# Patient Record
Sex: Female | Born: 2000 | Race: Black or African American | Hispanic: No | Marital: Single | Smoking: Never smoker
Health system: Southern US, Community
[De-identification: ages and names within clinical notes are randomized; demographics above are authoritative.]

## PROBLEM LIST (undated history)

## (undated) ENCOUNTER — Inpatient Hospital Stay (HOSPITAL_COMMUNITY): Payer: Self-pay

## (undated) DIAGNOSIS — J45909 Unspecified asthma, uncomplicated: Secondary | ICD-10-CM

## (undated) DIAGNOSIS — Z202 Contact with and (suspected) exposure to infections with a predominantly sexual mode of transmission: Secondary | ICD-10-CM

## (undated) HISTORY — PX: NO PAST SURGERIES: SHX2092

---
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
## 2018-06-14 ENCOUNTER — Emergency Department (HOSPITAL_COMMUNITY): Admission: EM | Admit: 2018-06-14 | Payer: No Typology Code available for payment source | Source: Home / Self Care

## 2018-09-14 ENCOUNTER — Ambulatory Visit (HOSPITAL_COMMUNITY)
Admission: EM | Admit: 2018-09-14 | Discharge: 2018-09-14 | Disposition: A | Discharge status: Home / Self Care | Payer: Medicaid Other | Attending: Family Medicine | Admitting: Family Medicine

## 2018-09-14 ENCOUNTER — Encounter (HOSPITAL_COMMUNITY): Payer: Self-pay | Admitting: Emergency Medicine

## 2018-09-14 ENCOUNTER — Other Ambulatory Visit: Payer: Self-pay

## 2018-09-14 DIAGNOSIS — Z3202 Encounter for pregnancy test, result negative: Secondary | ICD-10-CM | POA: Diagnosis not present

## 2018-09-14 DIAGNOSIS — N898 Other specified noninflammatory disorders of vagina: Secondary | ICD-10-CM | POA: Diagnosis not present

## 2018-09-14 LAB — POCT URINALYSIS DIP (DEVICE)
Bilirubin Urine: NEGATIVE
Glucose, UA: NEGATIVE mg/dL
Nitrite: NEGATIVE
Protein, ur: 100 mg/dL — AB
Specific Gravity, Urine: >=1.03 (ref 1.005–1.030)
Urobilinogen, UA: 1 mg/dL (ref 0.0–1.0)
pH: 5.5 (ref 5.0–8.0)

## 2018-09-14 LAB — POCT PREGNANCY, URINE: Preg Test, Ur: NEGATIVE

## 2018-09-14 MED ORDER — CLINDAMYCIN PHOSPHATE (1 DOSE) 2 % VA CREA: 5.0000 g | TOPICAL_CREAM | Freq: Every day | VAGINAL | 0 refills | Status: DC

## 2018-09-14 NOTE — ED Triage Notes (Signed)
Pt here for vaginal discharge  

## 2018-09-14 NOTE — ED Provider Notes (Signed)
[facility]    CSN: [id number] Arrival date & time: 09/14/18  1242      History   Chief Complaint Chief Complaint  Patient presents with  . Vaginal Discharge    HPI Connie Clark is a 18 y.o. female history of asthma, presenting today for evaluation of vaginal discharge.  Patient states that over the past week she has had occasional dysuria, notes her urine has been darker and with a stronger odor than normal.  She is also had associated discharge.  Occasional burning sensation associated with this.  Denies itching.  Denies pelvic pain, abdominal pain.  Denies nausea or vomiting.  Denies fevers.  Last menstrual cycle was at the end of July.  Is not on any form of birth control.  Patient does state that she had a history of BV previously, but believes she did not take the full medicine.  States that her urinary symptoms have been present since she gave birth 9 months ago.  She is not currently breast-feeding.  Patient has reported allergy to Flagyl causing lip swelling and sores on lips.  HPI  Past Medical History:  Diagnosis Date  . Asthma   . Chlamydia contact, treated     Patient Active Problem List   Diagnosis Date Noted  . Chlamydia 08/12/2016  . Lactose intolerance 03/04/2014  . Acne 07/12/2012  . Asthma 01/09/2012  . 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 NO PAST SURGERIES      OB History    Gravida  1   Para  1   Term  1   Preterm  0   AB  0   Living  1     SAB  0   TAB  0   Ectopic  0   Multiple  0   Live Births  1            Home Medications    Prior to Admission medications   Medication Sig Start Date End Date Taking? Authorizing Provider  albuterol (PROVENTIL HFA;VENTOLIN HFA) 108 (90 Base) MCG/ACT inhaler Inhale 1-2 puffs into the lungs every 6 (six) hours as needed for wheezing or shortness of breath.    [provider]  Clindamycin Phosphate, 1  Dose, vaginal cream Apply 5 g topically at bedtime. For 7 days 09/14/18   Dicky Boer C, PA-C  metroNIDAZOLE (METROGEL VAGINAL) 0.75 % vaginal gel Place 1 Applicatorful vaginally 2 (two) times daily. 12/09/17   Jorje Guild, NP  norethindrone (MICRONOR,CAMILA,ERRIN) 0.35 MG tablet Take 1 tablet (0.35 mg total) by mouth daily. 12/06/17   Jorje Guild, NP    Family History Family History  Problem Relation Age of Onset  . Hypertension Father     Social History Social History   Tobacco Use  . Smoking status: Never Smoker  . Smokeless tobacco: Never Used  Substance Use Topics  . Alcohol use: No    Frequency: Never  . Drug use: No     Allergies   Flagyl [metronidazole]   Review of Systems Review of Systems  Constitutional: Negative for fever.  Respiratory: Negative for shortness of breath.   Cardiovascular: Negative for chest pain.  Gastrointestinal: Negative for abdominal pain, diarrhea, nausea and vomiting.  Genitourinary: Positive for dysuria, urgency and vaginal discharge. Negative for flank pain, frequency, genital sores, hematuria, menstrual problem, vaginal bleeding and vaginal pain.  Musculoskeletal: Negative for back pain.  Skin: Negative for rash.  Neurological: Negative for dizziness, light-headedness and headaches.     Physical Exam Triage Vital Signs ED Triage Vitals  Enc Vitals Group     BP 09/14/18 1317 120/71     Pulse Rate 09/14/18 1317 61     Resp 09/14/18 1317 18     Temp 09/14/18 1317 98.3 F (36.8 C)     Temp Source 09/14/18 1317 Oral     SpO2 09/14/18 1317 100 %     Weight --      Height --      Head Circumference --      Peak Flow --      Pain Score 09/14/18 1318 0     Pain Loc --      Pain Edu? --      Excl. in [place]? --    No data found.  Updated Vital Signs BP 120/71 (BP Location: Right Arm)   Pulse 61   Temp 98.3 F (36.8 C) (Oral)   Resp 18   SpO2 100%   Visual Acuity Right Eye Distance:   Left Eye Distance:   Bilateral  Distance:    Right Eye Near:   Left Eye Near:    Bilateral Near:     Physical Exam Vitals signs and nursing note reviewed.  Constitutional:      General: She is not in acute distress.    Appearance: She is well-developed.  HENT:     Head: Normocephalic and atraumatic.  Eyes:     Conjunctiva/sclera: Conjunctivae normal.  Neck:     Musculoskeletal: Neck supple.  Cardiovascular:     Rate and Rhythm: Normal rate and regular rhythm.     Heart sounds: No murmur.  Pulmonary:     Effort: Pulmonary effort is normal. No respiratory distress.     Breath sounds: Normal breath sounds.  Abdominal:     Palpations: Abdomen is soft.     Tenderness: There is no abdominal tenderness.     Comments: Soft, nondistended, nontender to light deep palpation throughout abdomen.  Skin:    General: Skin is warm and dry.  Neurological:     Mental Status: She is alert.      UC Treatments / Results  Labs (all labs ordered are listed, but only abnormal results are displayed) Labs Reviewed  POCT URINALYSIS DIP (DEVICE) - Abnormal; Notable for the following components:      Result Value   Ketones, ur TRACE (*)    Hgb urine dipstick MODERATE (*)    Protein, ur 100 (*)    Leukocytes,Ua SMALL (*)    All other components within normal limits  URINE CULTURE  POC URINE PREG, ED  POCT PREGNANCY, URINE  CERVICOVAGINAL ANCILLARY ONLY    EKG   Radiology No results found.  Procedures Procedures (including critical care time)  Medications Ordered in UC Medications - No data to display  Initial Impression / Assessment and Plan / UC Course  I have reviewed the triage vital signs and the nursing notes.  Pertinent labs & imaging results that were available during my care of the patient were reviewed by me and considered in my medical decision making (see chart for details).     Patient with abnormal vaginal discharge as well as urinary symptoms.  UA mildly suggestive of UTI versus small leuks  from discharge, but given patient's reported urinary symptoms have been going on since she was last pregnant will defer treatment empirically treat for BV.  Urine culture obtained.  Will  treat with clindamycin vaginal cream at bedtime x7 days.  Will call with results of culture and vaginal swab and alter treatment as needed.Discussed strict return precautions. Patient verbalized understanding and is agreeable with plan.  Final Clinical Impressions(s) / UC Diagnoses   Final diagnoses:  Vaginal discharge     Discharge Instructions     Clindamycin vaginal cream nightly for 7 days to treat bacterial vaginosis  I will call with results of urine and send iin further meds if needed  Drink plenty of fluids  Swab should return in 2-3 days  Follow up if symptoms not resolivng or wrosening   ED Prescriptions    Medication Sig Dispense Auth. Provider   Clindamycin Phosphate, 1 Dose, vaginal cream Apply 5 g topically at bedtime. For 7 days 35 g Chianna Spirito, WaikeleHallie C, PA-C     Controlled Substance Prescriptions [place] Controlled Substance Registry consulted? Not Applicable   Lew DawesWieters, Coley Kulikowski C, [place]PA-C 09/14/18 1552

## 2018-09-14 NOTE — Discharge Instructions (Addendum)
Clindamycin vaginal cream nightly for 7 days to treat bacterial vaginosis  I will call with results of urine and send iin further meds if needed  Drink plenty of fluids  Swab should return in 2-3 days  Follow up if symptoms not resolivng or wrosening

## 2018-09-17 ENCOUNTER — Telehealth (HOSPITAL_COMMUNITY): Payer: Self-pay | Admitting: Emergency Medicine

## 2018-09-17 LAB — URINE CULTURE: Culture: >=100000 — AB

## 2018-09-17 MED ORDER — CEPHALEXIN 500 MG PO CAPS: 500.0000 mg | ORAL_CAPSULE | Freq: Two times a day (BID) | ORAL | 0 refills | Status: AC

## 2018-09-17 NOTE — Telephone Encounter (Signed)
Urine culture positive for >100 e coli, pt was not treated, will send antibiotics to preferred pharmacy, pt contacted and made aware, all questions answered.

## 2018-09-20 LAB — CERVICOVAGINAL ANCILLARY ONLY
Candida vaginitis: NEGATIVE
Chlamydia: NEGATIVE
Neisseria Gonorrhea: NEGATIVE
Trichomonas: NEGATIVE

## 2019-02-18 ENCOUNTER — Encounter (HOSPITAL_COMMUNITY): Payer: Self-pay

## 2019-02-18 ENCOUNTER — Other Ambulatory Visit: Payer: Self-pay

## 2019-02-18 ENCOUNTER — Ambulatory Visit (HOSPITAL_COMMUNITY)
Admission: EM | Admit: 2019-02-18 | Discharge: 2019-02-18 | Disposition: A | Discharge status: Home / Self Care | Payer: Medicaid Other | Attending: Family Medicine | Admitting: Family Medicine

## 2019-02-18 DIAGNOSIS — N760 Acute vaginitis: Secondary | ICD-10-CM | POA: Diagnosis not present

## 2019-02-18 DIAGNOSIS — B9689 Other specified bacterial agents as the cause of diseases classified elsewhere: Secondary | ICD-10-CM

## 2019-02-18 MED ORDER — CLINDAMYCIN HCL 150 MG PO CAPS: 150.0000 mg | ORAL_CAPSULE | Freq: Four times a day (QID) | ORAL | 1 refills | Status: DC

## 2019-02-18 MED ORDER — FLUCONAZOLE 150 MG PO TABS: 150.0000 mg | ORAL_TABLET | Freq: Every day | ORAL | 1 refills | Status: DC

## 2019-02-18 NOTE — ED Triage Notes (Signed)
Pt presents with thick vaginal discharge and vaginal odor for about a month.

## 2019-02-18 NOTE — ED Provider Notes (Signed)
[facility]    CSN: [id number] Arrival date & time: 02/18/19  1929      History   Chief Complaint Chief Complaint  Patient presents with  . Vaginitis    HPI Connie Clark is a 19 y.o. female.   HPI  Patient has recurring vaginal infections.  She has recurring BV.  She states she is allergic to metronidazole.  She is used metronidazole gel.  She is use clindamycin gel.  In spite of that she continues to have infections over and over again. She had a baby he is a little over 1.  She thinks that she has had infections ever since his birth.  She has 1 sex partner.  And says she does not have an STD.  Agrees to STD testing  Past Medical History:  Diagnosis Date  . Asthma   . Chlamydia contact, treated     Patient Active Problem List   Diagnosis Date Noted  . Chlamydia 08/12/2016  . Lactose intolerance 03/04/2014  . Acne 07/12/2012  . Asthma 01/09/2012  . 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 NO PAST SURGERIES      OB History    Gravida  1   Para  1   Term  1   Preterm  0   AB  0   Living  1     SAB  0   TAB  0   Ectopic  0   Multiple  0   Live Births  1            Home Medications    Prior to Admission medications   Medication Sig Start Date End Date Taking? Authorizing Provider  albuterol (PROVENTIL HFA;VENTOLIN HFA) 108 (90 Base) MCG/ACT inhaler Inhale 1-2 puffs into the lungs every 6 (six) hours as needed for wheezing or shortness of breath.    [provider]  clindamycin (CLEOCIN) 150 MG capsule Take 1 capsule (150 mg total) by mouth every 6 (six) hours. 02/18/19   Eustace Moore, MD  fluconazole (DIFLUCAN) 150 MG tablet Take 1 tablet (150 mg total) by mouth daily. Repeat in 1 week if needed 02/18/19   Eustace Moore, MD  norethindrone (MICRONOR,CAMILA,ERRIN) 0.35 MG tablet Take 1 tablet (0.35 mg total) by mouth daily. 12/06/17   Judeth Horn, NP    Family History Family History  Problem Relation Age of Onset  . Hypertension Father     Social History Social History   Tobacco Use  . Smoking status: Never Smoker  . Smokeless tobacco: Never Used  Substance Use Topics  . Alcohol use: No  . Drug use: No     Allergies   Flagyl [metronidazole]   Review of Systems Review of Systems  Genitourinary: Positive for vaginal discharge. Negative for dysuria and pelvic pain.     Physical Exam Triage Vital Signs ED Triage Vitals  Enc Vitals Group     BP 02/18/19 1943 119/81     Pulse Rate 02/18/19 1943 (!) 59     Resp 02/18/19 1943 18     Temp 02/18/19 1943 98.6 F (37 C)     Temp Source 02/18/19 1943 Oral     SpO2 02/18/19 1943 100 %     Weight --      Height --      Head Circumference --      Peak Flow --      Pain Score  02/18/19 1945 0     Pain Loc --      Pain Edu? --      Excl. in [place]? --    No data found.  Updated Vital Signs BP 119/81 (BP Location: Left Arm)   Pulse (!) 59   Temp 98.6 F (37 C) (Oral)   Resp 18   LMP 02/05/2019   SpO2 100%        Physical Exam Constitutional:      General: She is not in acute distress.    Appearance: She is well-developed and normal weight.  HENT:     Head: Normocephalic and atraumatic.     Mouth/Throat:     Comments: Mask in place Eyes:     Conjunctiva/sclera: Conjunctivae normal.     Pupils: Pupils are equal, round, and reactive to light.  Cardiovascular:     Rate and Rhythm: Normal rate.  Pulmonary:     Effort: Pulmonary effort is normal. No respiratory distress.  Genitourinary:    Comments: Copious white-yellow discharge, malodorous, per patient Musculoskeletal:        General: Normal range of motion.     Cervical back: Normal range of motion.  Skin:    General: Skin is warm and dry.  Neurological:     Mental Status: She is alert.      UC Treatments / Results  Labs (all labs ordered are listed, but only abnormal results are displayed) Labs  Reviewed  CERVICOVAGINAL ANCILLARY ONLY    EKG   Radiology No results found.  Procedures Procedures (including critical care time)  Medications Ordered in UC Medications - No data to display  Initial Impression / Assessment and Plan / UC Course  I have reviewed the triage vital signs and the nursing notes.  Pertinent labs & imaging results that were available during my care of the patient were reviewed by me and considered in my medical decision making (see chart for details).     Current BV, refer to GYN Final Clinical Impressions(s) / UC Diagnoses   Final diagnoses:  BV (bacterial vaginosis)     Discharge Instructions     Take the clindamycin 3 x a day for 7 days Take the diflucan now and again in one week  Repeat if needed  Start a probiotic for women every day   ED Prescriptions    Medication Sig Dispense Auth. Provider   clindamycin (CLEOCIN) 150 MG capsule Take 1 capsule (150 mg total) by mouth every 6 (six) hours. 28 capsule Raylene Everts, MD   fluconazole (DIFLUCAN) 150 MG tablet Take 1 tablet (150 mg total) by mouth daily. Repeat in 1 week if needed 2 tablet Raylene Everts, MD     PDMP not reviewed this encounter.   Raylene Everts, MD 02/18/19 2016

## 2019-02-18 NOTE — Discharge Instructions (Addendum)
Take the clindamycin 3 x a day for 7 days Take the diflucan now and again in one week  Repeat if needed  Start a probiotic for women every day

## 2019-02-21 ENCOUNTER — Encounter (HOSPITAL_COMMUNITY): Payer: Self-pay

## 2019-02-21 ENCOUNTER — Telehealth (HOSPITAL_COMMUNITY): Payer: Self-pay | Admitting: Emergency Medicine

## 2019-02-21 LAB — CERVICOVAGINAL ANCILLARY ONLY
Bacterial vaginitis: POSITIVE — AB
Candida vaginitis: NEGATIVE
Chlamydia: POSITIVE — AB
Neisseria Gonorrhea: NEGATIVE
Trichomonas: POSITIVE — AB

## 2019-02-21 MED ORDER — AZITHROMYCIN 250 MG PO TABS: 1000.0000 mg | ORAL_TABLET | Freq: Once | ORAL | 0 refills | Status: AC

## 2019-02-21 MED ORDER — TINIDAZOLE 500 MG PO TABS: 2.0000 g | ORAL_TABLET | Freq: Once | ORAL | 0 refills | Status: AC

## 2019-02-21 NOTE — Telephone Encounter (Signed)
Bacterial Vaginosis test is positive.  Prescription for clindamycin was given at the urgent care visit.  Trichomonas is positive. Rx  for tinidazole 2 grams per dr. Delton See, once was sent to the pharmacy of record. Pt needs education to refrain from sexual intercourse for 7 days to give the medicine time to work. Sexual partners need to be notified and tested/treated. Condoms may reduce risk of reinfection. Recheck for further evaluation if symptoms are not improving.   Chlamydia is positive.  Rx po zithromax 1g #1 dose no refills was sent to the pharmacy of record.  Please refrain from sexual intercourse for 7 days to give the medicine time to work, sexual partners need to be notified and tested/treated.  Condoms may reduce risk of reinfection.  Recheck or followup with PCP for further evaluation if symptoms are not improving.   [facility] notified.  Attempted to reach patient. No answer at this time. Voicemail left.

## 2019-03-07 ENCOUNTER — Encounter: Payer: Self-pay | Admitting: *Deleted

## 2019-04-01 ENCOUNTER — Encounter: Payer: Self-pay | Admitting: Family Medicine

## 2019-04-01 ENCOUNTER — Other Ambulatory Visit (HOSPITAL_COMMUNITY)
Admission: RE | Admit: 2019-04-01 | Discharge: 2019-04-01 | Disposition: A | Discharge status: Home / Self Care | Payer: Medicaid Other | Source: Ambulatory Visit | Attending: Family Medicine | Admitting: Family Medicine

## 2019-04-01 ENCOUNTER — Ambulatory Visit (INDEPENDENT_AMBULATORY_CARE_PROVIDER_SITE_OTHER): Payer: Medicaid Other | Admitting: Family Medicine

## 2019-04-01 ENCOUNTER — Other Ambulatory Visit: Payer: Self-pay

## 2019-04-01 VITALS — BP 125/80 | HR 101 | Wt 134.7 lb

## 2019-04-01 DIAGNOSIS — N898 Other specified noninflammatory disorders of vagina: Secondary | ICD-10-CM

## 2019-04-01 NOTE — Progress Notes (Signed)
   Subjective:    Patient ID: Connie Clark, female    DOB: 02/06/2001, 19 y.o.   MRN: [id number]  HPI Patient seen for recurrent vaginitis. For past several months, feels like she constantly has foul odor with discharge. At the end of January, did have +BV, trich, CT. Was treated and wasn't with that partner again. Was not sexually active while being treated. Current partner is a previous partner. Uses dial soap. Doesn't wear cotton underwear as she finds that it makes odor worse.   I have reviewed the patients past medical, family, and social history.  I have reviewed the patient's medication list and allergies.   Review of Systems     Objective:   Physical Exam Vitals reviewed. Exam conducted with a chaperone present.  Constitutional:      Appearance: Normal appearance.  Cardiovascular:     Pulses: Normal pulses.  Pulmonary:     Effort: Pulmonary effort is normal.  Abdominal:     General: Abdomen is flat.     Palpations: Abdomen is soft.     Hernia: There is no hernia in the left inguinal area or right inguinal area.  Genitourinary:    Labia:        Right: No rash, tenderness, lesion or injury.        Left: No rash, tenderness, lesion or injury.      Urethra: No prolapse, urethral pain, urethral swelling or urethral lesion.  Lymphadenopathy:     Lower Body: No right inguinal adenopathy. No left inguinal adenopathy.  Skin:    Capillary Refill: Capillary refill takes less than 2 seconds.  Neurological:     Mental Status: She is alert.  Psychiatric:        Mood and Affect: Mood normal.        Thought Content: Thought content normal.        Judgment: Judgment normal.         Assessment & Plan:  1. Vaginal odor TOC today. May need prolonged treatment for current BV. - Cervicovaginal ancillary only( [place])

## 2019-04-03 LAB — CERVICOVAGINAL ANCILLARY ONLY
Bacterial Vaginitis (gardnerella): POSITIVE — AB
Candida Glabrata: NEGATIVE
Candida Vaginitis: NEGATIVE
Chlamydia: NEGATIVE
Comment: NEGATIVE
Comment: NEGATIVE
Comment: NEGATIVE
Comment: NEGATIVE
Comment: NEGATIVE
Comment: NORMAL
Neisseria Gonorrhea: NEGATIVE
Trichomonas: POSITIVE — AB

## 2019-04-04 ENCOUNTER — Telehealth: Payer: Self-pay | Admitting: Lactation Services

## 2019-04-04 ENCOUNTER — Telehealth (HOSPITAL_COMMUNITY): Payer: Self-pay | Admitting: Lactation Services

## 2019-04-04 MED ORDER — TINIDAZOLE 500 MG PO TABS: 2.0000 g | ORAL_TABLET | Freq: Every day | ORAL | 1 refills | Status: DC

## 2019-04-04 MED ORDER — TINIDAZOLE 500 MG PO TABS: 2.0000 g | ORAL_TABLET | Freq: Every day | ORAL | 0 refills | Status: AC

## 2019-04-04 NOTE — Addendum Note (Signed)
Addended by: Levie Heritage on: 04/04/2019 02:21 PM   Modules accepted: Orders

## 2019-04-04 NOTE — Telephone Encounter (Signed)
Called patient and informed her that she is positive for Trich and BV. Tindamax treatment sent for 2 days as patient is allergic to Flagyl. Patient reports she has seen the results. She was instructed to take the medication as prescribed and to have partner call the [facility] for treatment. Advised patient to abstain from intercourse for 2 weeks after both partners are treated. Patient voiced understanding.

## 2019-04-04 NOTE — Telephone Encounter (Signed)
Opened in error

## 2019-04-09 ENCOUNTER — Telehealth: Payer: Self-pay | Admitting: Obstetrics and Gynecology

## 2019-04-09 NOTE — Telephone Encounter (Signed)
Left message on VM that in regards to her question of her partner getting tx.  I left message stating that she needs to request the refill that was sent to her [facility] on [address].  If she has any questions to please call the office.  MyChart message sent as well.    Addison Naegeli, RN

## 2019-04-09 NOTE — Telephone Encounter (Signed)
The patient stated Dr. Adrian Blackwater informed her he would send a prescription in for her partner but she stated she never received information on it. She would like a call back as soon as possible.

## 2019-04-10 ENCOUNTER — Telehealth: Payer: Self-pay | Admitting: Lactation Services

## 2019-04-10 ENCOUNTER — Telehealth: Payer: Self-pay | Admitting: Obstetrics & Gynecology

## 2019-04-10 ENCOUNTER — Telehealth: Payer: Self-pay | Admitting: *Deleted

## 2019-04-10 ENCOUNTER — Other Ambulatory Visit: Payer: Self-pay | Admitting: Family Medicine

## 2019-04-10 MED ORDER — TINIDAZOLE 500 MG PO TABS: 2.0000 g | ORAL_TABLET | Freq: Every day | ORAL | 0 refills | Status: DC

## 2019-04-10 NOTE — Telephone Encounter (Signed)
The patient called in very upset stating she has been calling in for a prescription refill. She stated she spoke with a nurse (Diane) who told her she would send it in however when she went to pick up the order [facility] is saying the order is being delayed. She would like a nurse to call the pharmacy and resolve the issue so that she can pick up the prescriptions and give her a call back asap.

## 2019-04-10 NOTE — Telephone Encounter (Signed)
Pt called office with questions regarding a medication refill.  I called pt back and discussed her concern. She stated that she was given medication to treat STI (trichomonas) and was supposed to have a refill in order to treat her partner. Her prescription bottle states no refill available. She requested a refill in her online profile with [facility] thinking she would get the tindamax for partner and was given Clindamycin. Per chart review, pt had been given Clindamycin in January for +BV. Pt was advised that her recent wet prep showed presence of BV also, so she can take the Clindamycin refill if she is having sx of BV presently. I advised pt that I will call [facility] to discuss the refill of tindamax needed and she will be able to pick it up later today. Pt voiced understanding of all information and instructions given.   I called and spoke with [facility] pharmacy associate. She reviewed the e-prescribed prescription hard copy and stated that it clearly stated "No refill".  This is not consistent with information seen in pt EMR record. I stated that I would e-prescribe a refill Rx for this medication.

## 2019-04-10 NOTE — Telephone Encounter (Signed)
Called patient to let her know I just called the [facility] on [address] and they report her prescription for Tindamax is ready. Patient voiced understanding.

## 2019-04-12 ENCOUNTER — Telehealth (INDEPENDENT_AMBULATORY_CARE_PROVIDER_SITE_OTHER): Payer: Medicaid Other | Admitting: Lactation Services

## 2019-04-12 DIAGNOSIS — A749 Chlamydial infection, unspecified: Secondary | ICD-10-CM

## 2019-04-12 NOTE — Telephone Encounter (Signed)
Received a Pre Authorization request for Tindamax. This Tindamax prescription was sent for partner treatment. Patient and partner were able to pick up prescriptions and has been treated with Tindamax. Patient with no questions or concerns at this time.

## 2019-08-01 IMAGING — US US OB < 14 WEEKS - US OB TV
1 series · 15 of 28 positions shown · non-contrast
Comparison: None.

CLINICAL DATA: Abdominal pain and cramping.  Unsure of LMP.

EXAM:
OBSTETRIC <14 WK US AND TRANSVAGINAL OB US
TECHNIQUE: Both transabdominal and transvaginal ultrasound examinations were
performed for complete evaluation of the gestation as well as the
maternal uterus, adnexal regions, and pelvic cul-de-sac.
Transvaginal technique was performed to assess early pregnancy.

[Series 1: us ob < 14 weeks - us ob tv · 15 of 35 slices shown]
[im 1/35]
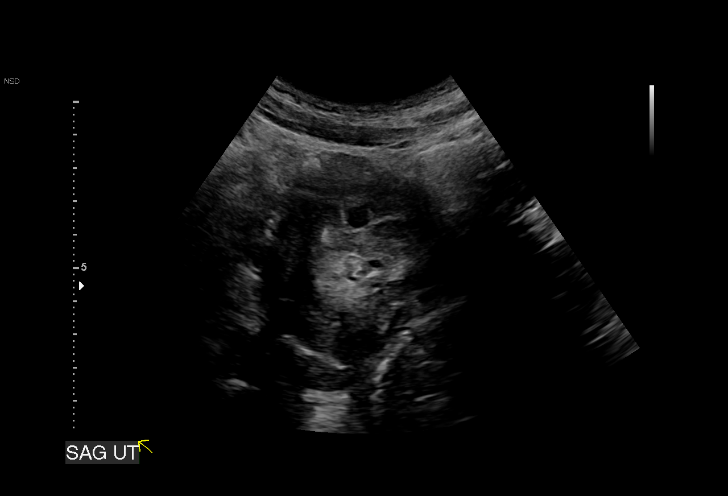
[im 3/35]
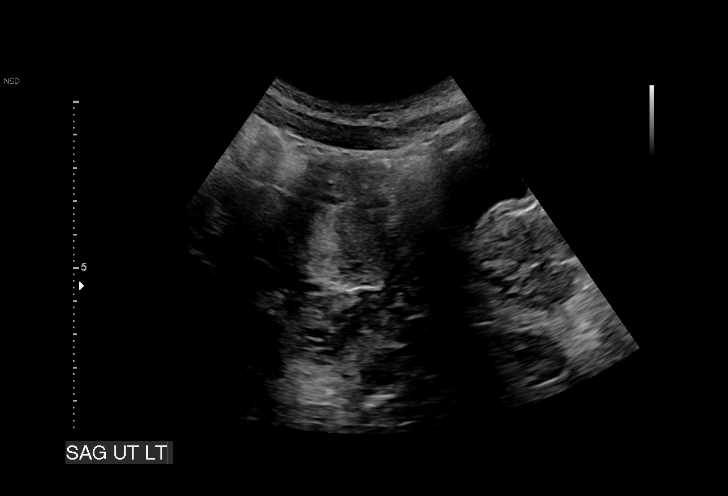
[im 6/35]
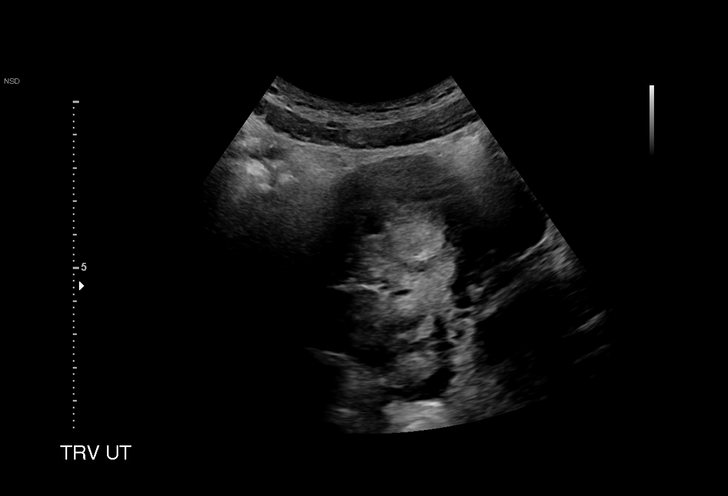
[im 8/35]
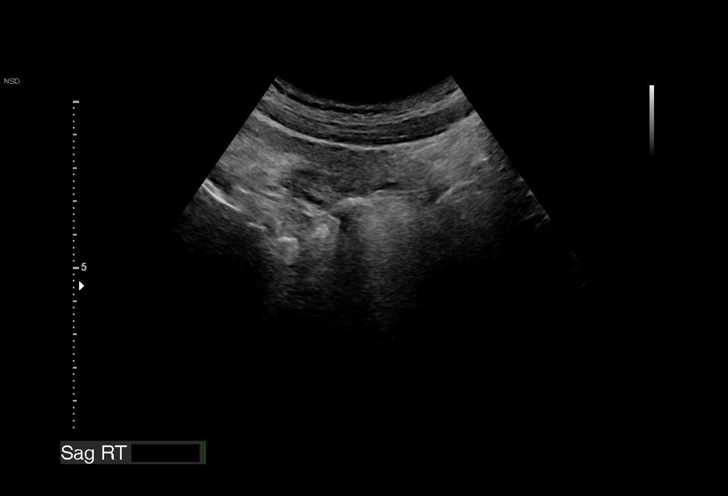
[im 11/35]
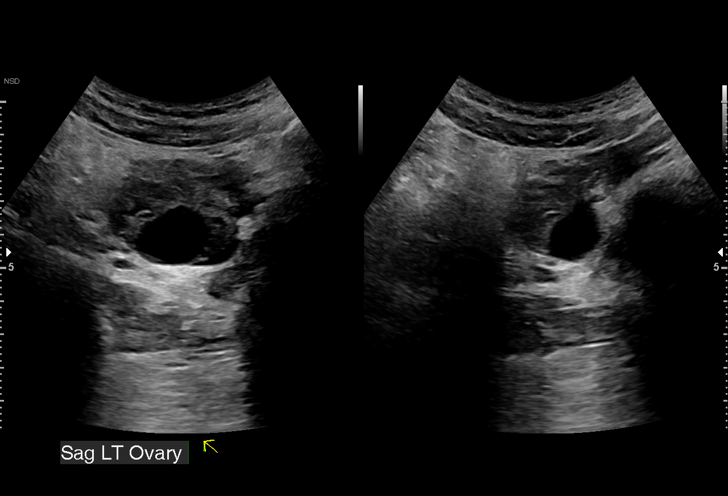
[im 13/35]
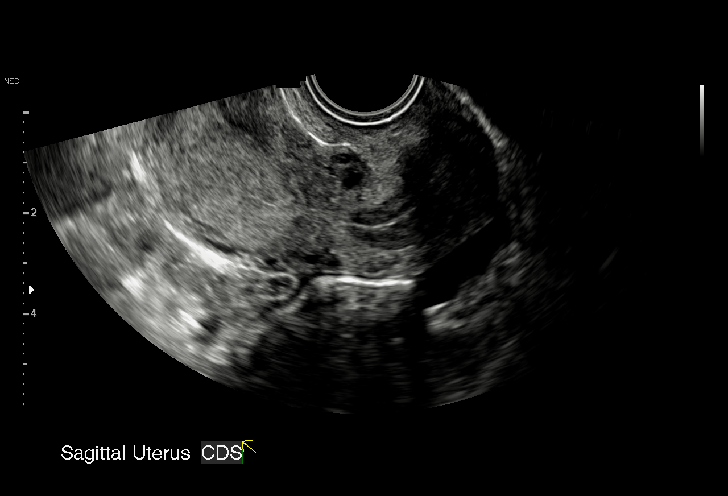
[im 16/35]
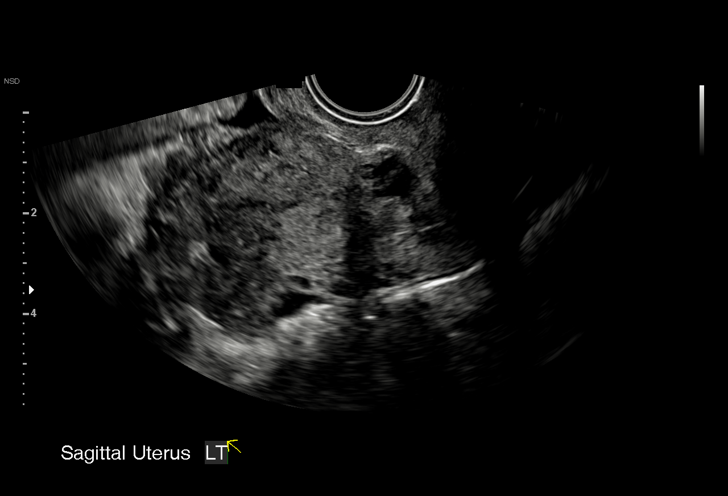
[im 18/35]
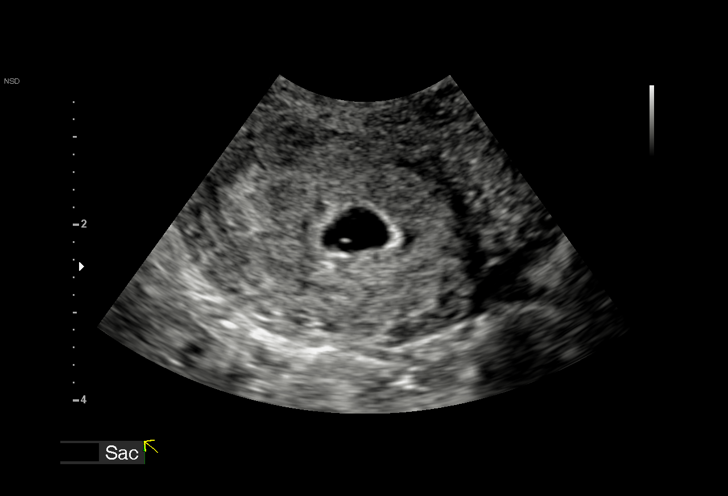
[im 19/35]
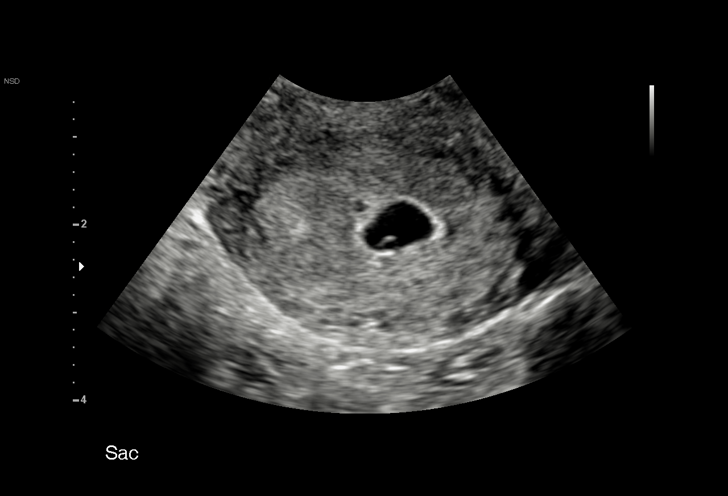
[im 22/35]
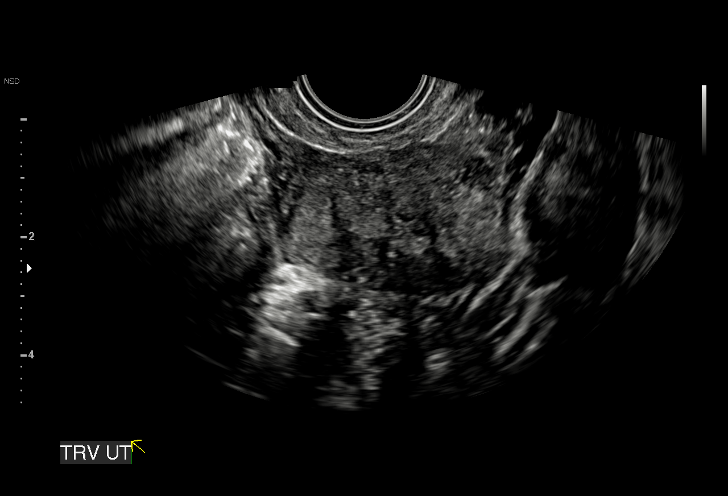
[im 24/35]
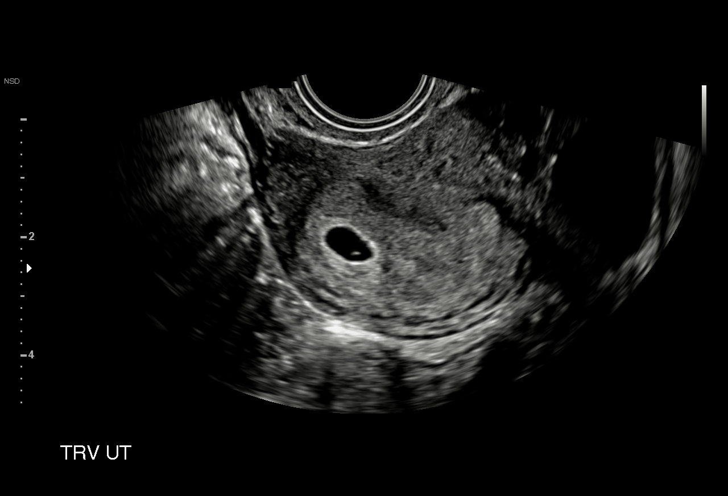
[im 27/35]
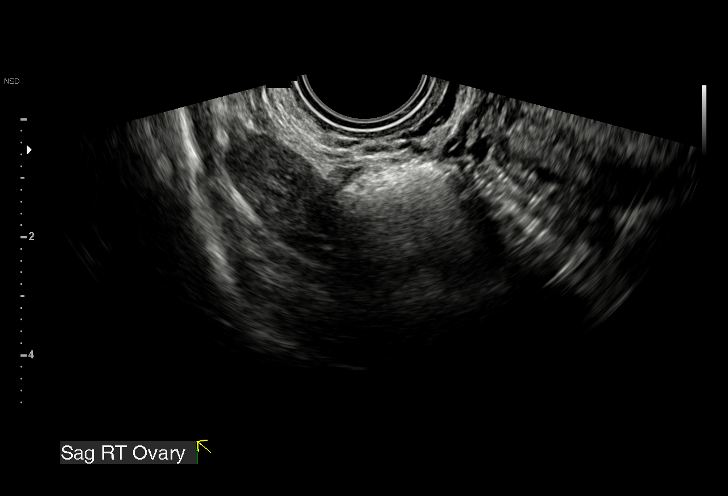
[im 29/35]
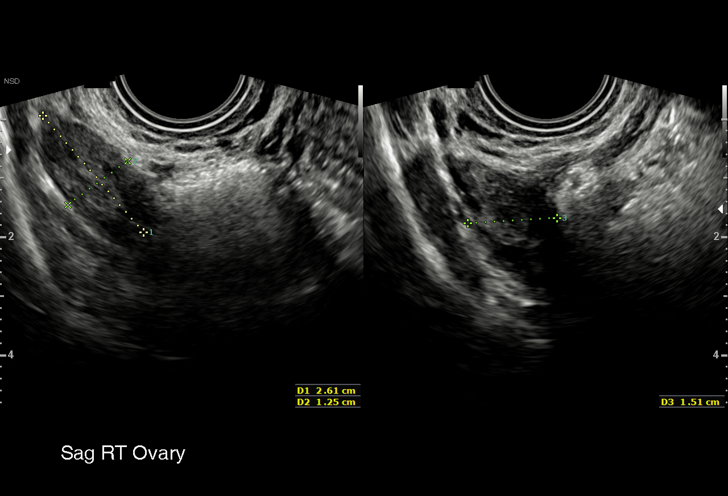
[im 32/35]
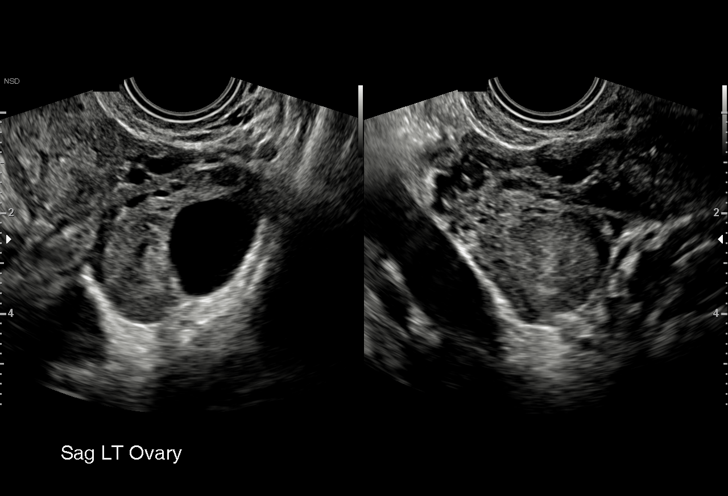
[im 35/35]
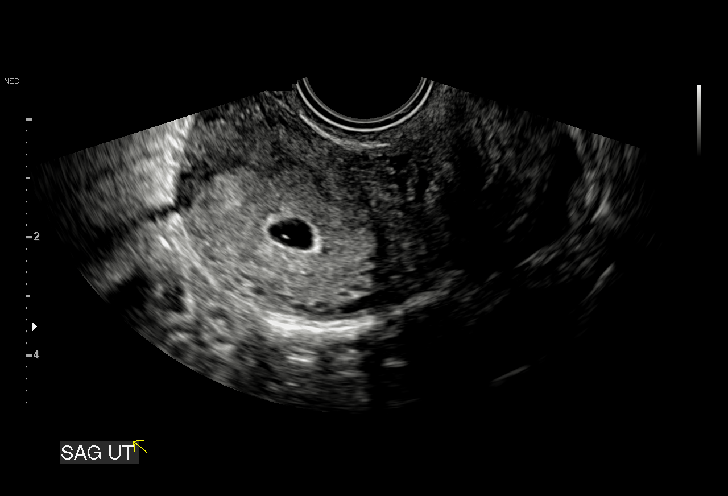

[15 of 28 positions shown; findings below may reference images not displayed]

FINDINGS: Intrauterine gestational sac: Single

Yolk sac:  Visualized.

Embryo:  Not Visualized.

MSD: 8 mm   5 w   3 d

Subchorionic hemorrhage:  None visualized.

Maternal uterus/adnexae: Both ovaries are normal in appearance.
Small left ovarian corpus luteum cyst noted. No adnexal mass or
abnormal free fluid identified.
IMPRESSION: Single intrauterine gestational sac, with estimated gestational age
of 5 weeks 3 days by mean sac diameter. Consider following serial
b-hCG levels, with followup ultrasound to assess viability in 10-14
days.

## 2019-10-12 ENCOUNTER — Ambulatory Visit (HOSPITAL_COMMUNITY)
Admission: EM | Admit: 2019-10-12 | Discharge: 2019-10-12 | Disposition: A | Discharge status: Home / Self Care | Payer: No Typology Code available for payment source

## 2019-10-12 ENCOUNTER — Other Ambulatory Visit: Payer: Self-pay

## 2019-10-12 NOTE — ED Notes (Signed)
Connie Clark, patient access, reports patient left when told what wait time was.  No contact with clinical staff

## 2019-11-05 IMAGING — US US MFM OB COMP +14 WKS
1 series · 14 of 28 positions shown · non-contrast
Comparison: none

[Series 1: us mfm ob comp +14 wks · 47 acquisitions, 14 frames shown]
[im 2/47]
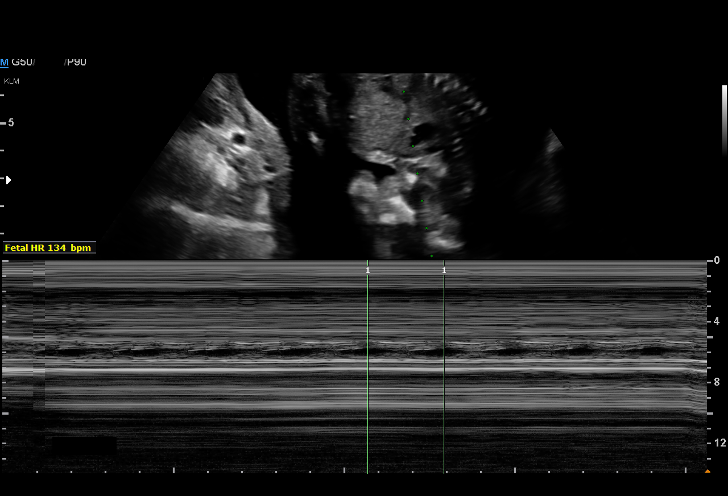
[im 6/47]
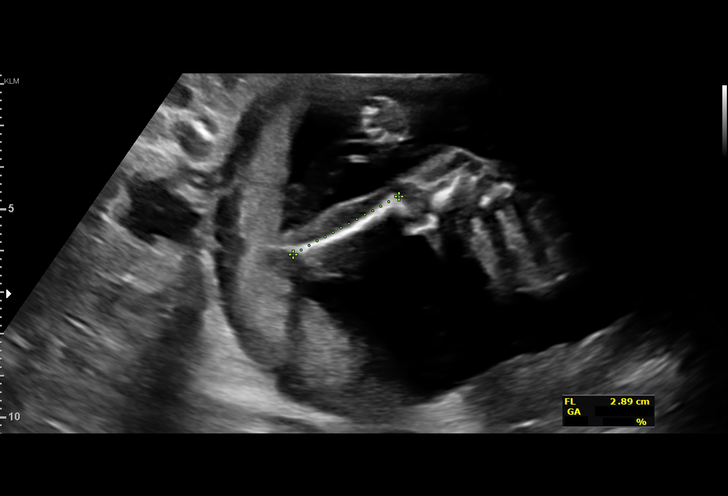
[im 9/47]
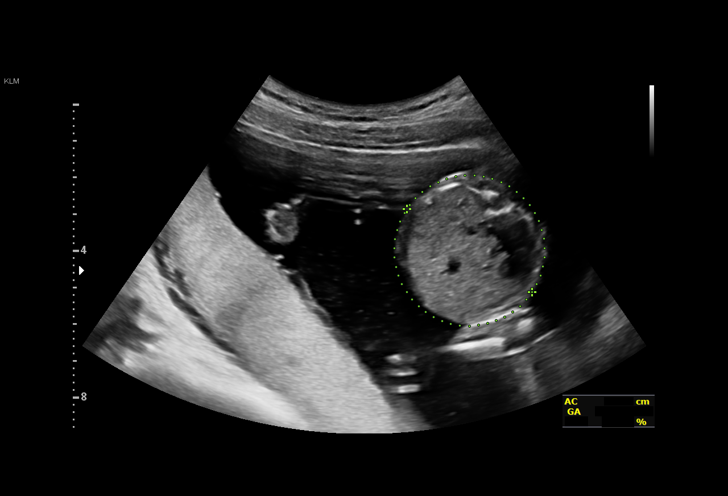
[im 12/47]
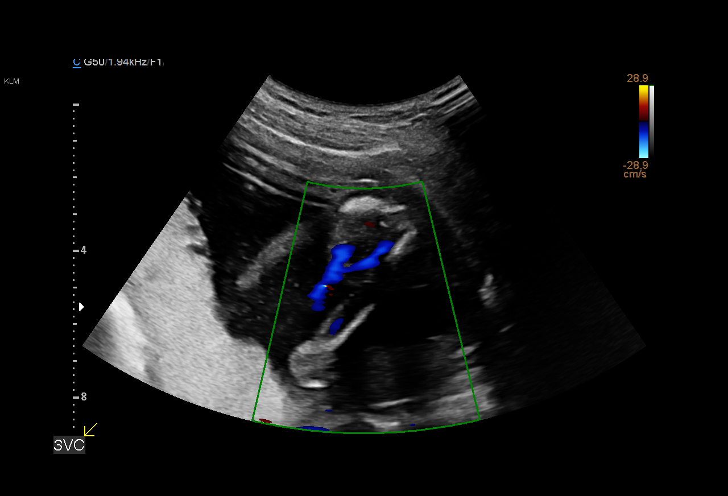
[im 16/47]
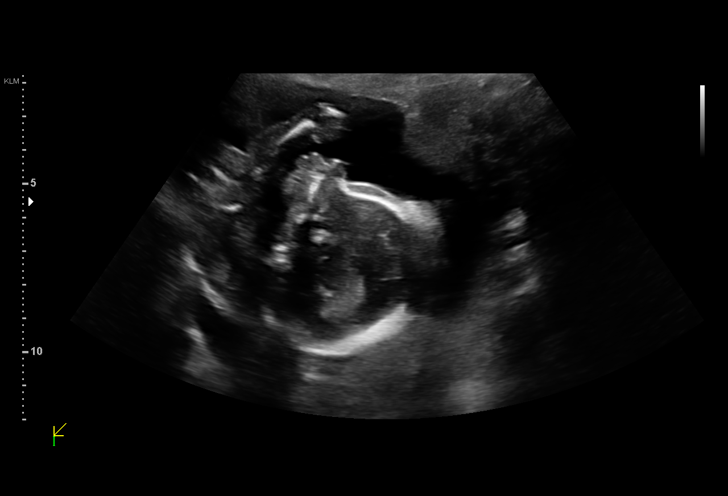
[im 19/47]
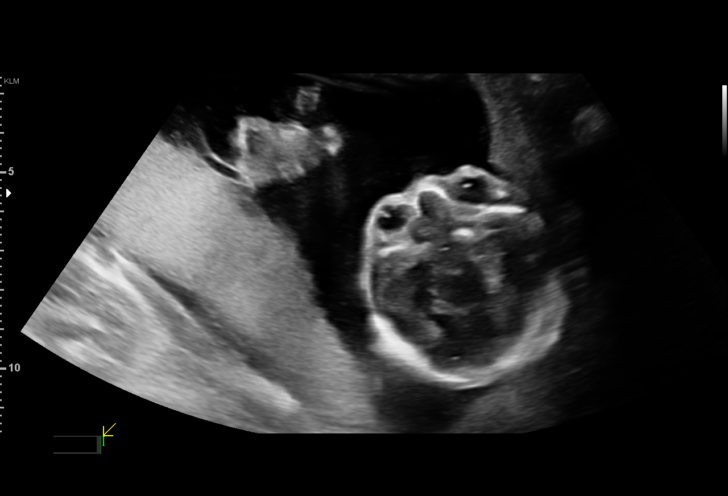
[im 23/47]
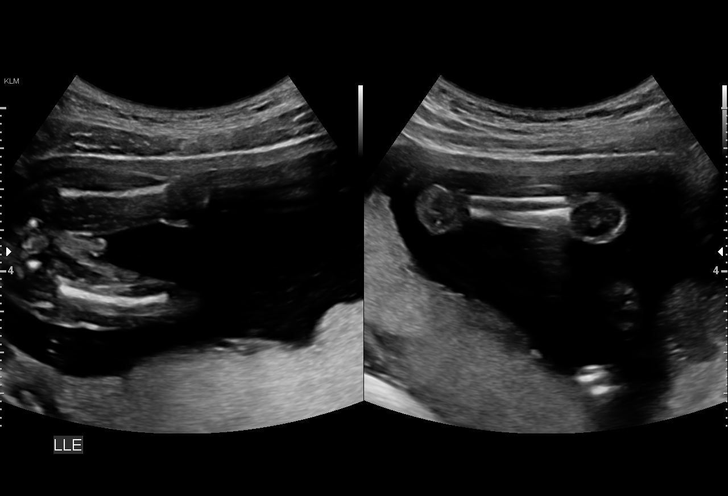
[im 26/47]
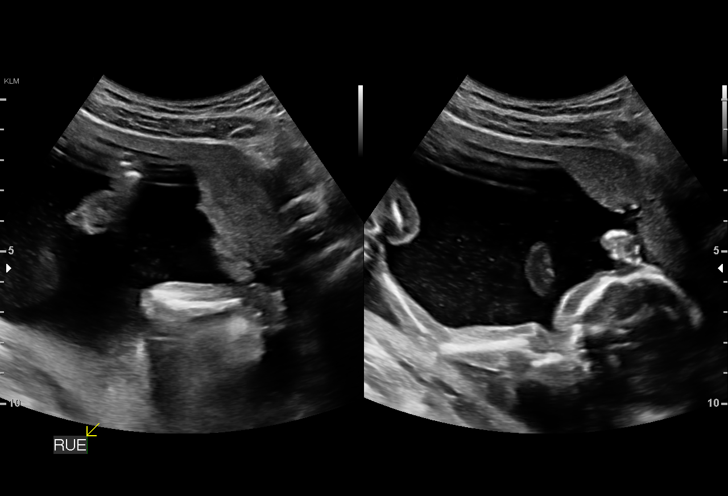
[im 29/47]
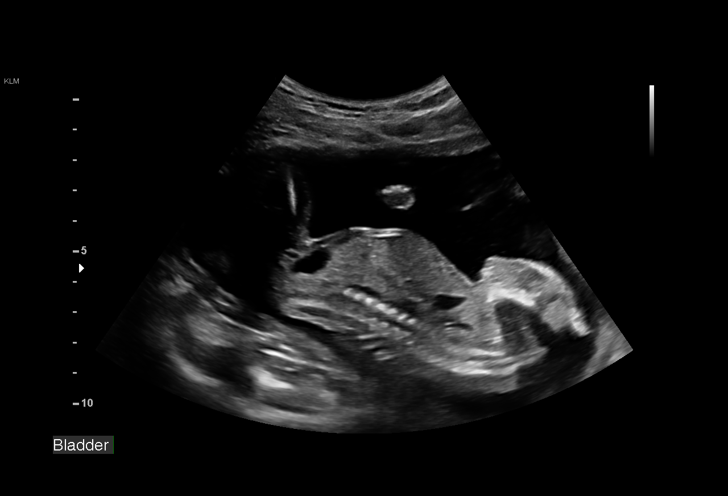
[im 33/47]
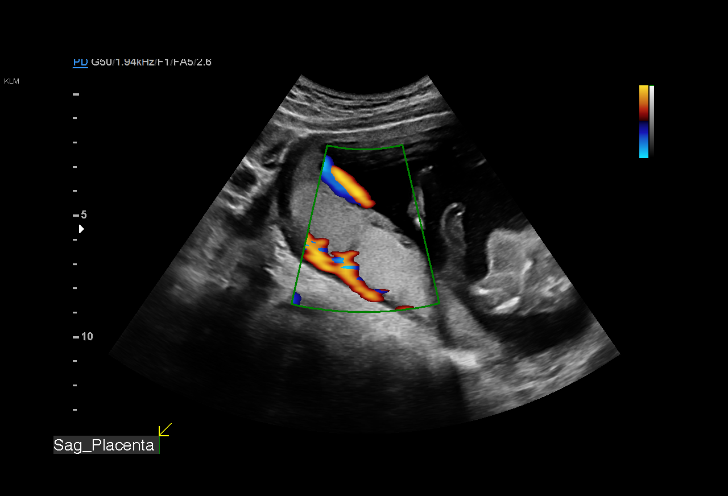
[im 36/47]
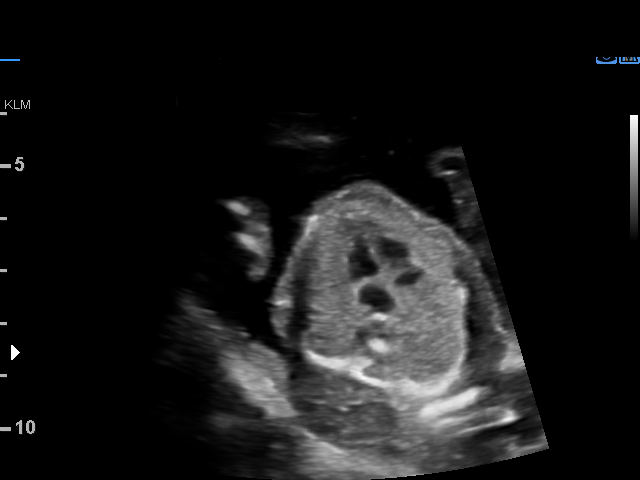
[im 40/47]
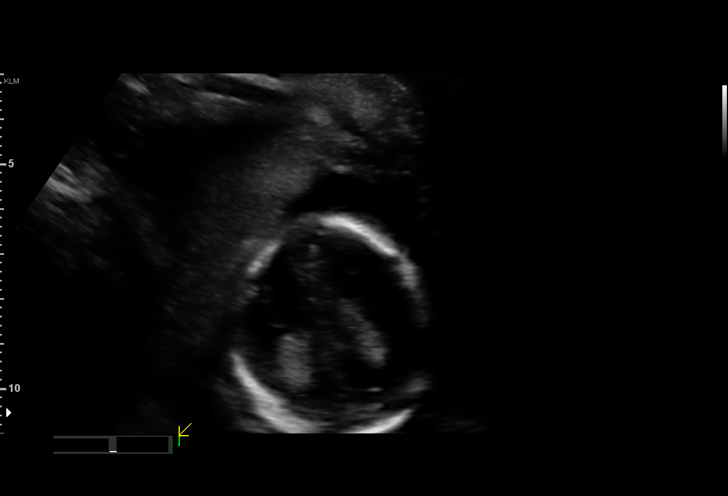
[im 43/47]
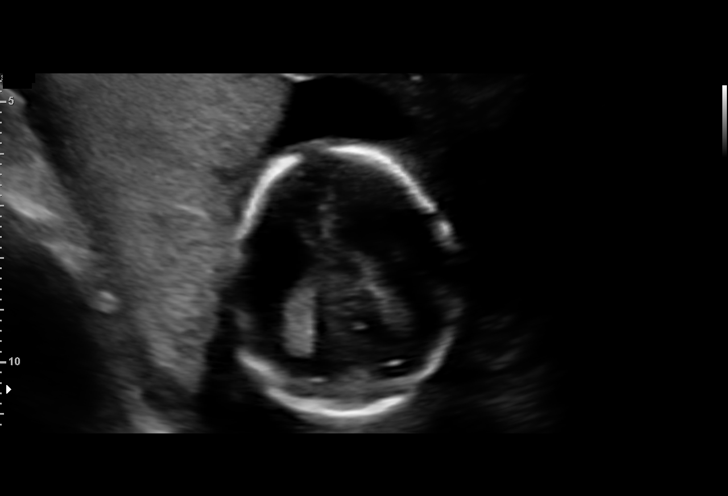
[im 47/47]
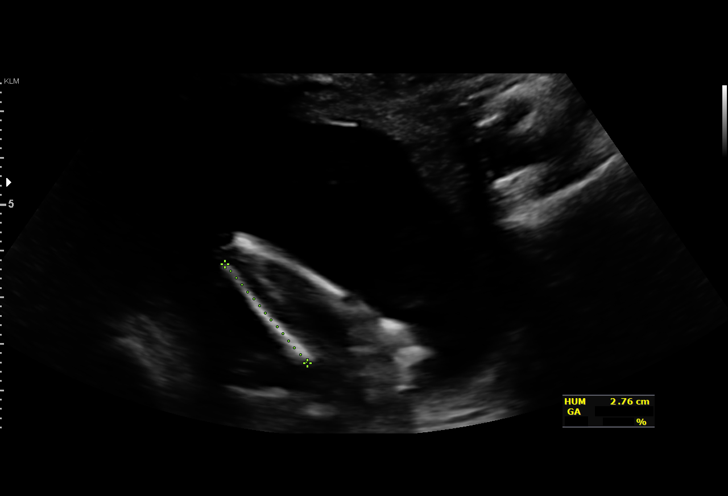

[14 of 28 positions shown; findings below may reference images not displayed]

OB/Gyn Clinic

Indications

18 weeks gestation of pregnancy
Encounter for antenatal screening for
malformations
OB History

Gravidity:    1         Term:   0        Prem:   0        SAB:   0
TOP:          0       Ectopic:  0        Living: 0
Fetal Evaluation

Num Of Fetuses:     1
Fetal Heart         134
Rate(bpm):
Cardiac Activity:   Observed
Presentation:       Cephalic
Placenta:           Posterior, low-lying, 1.8cm from int os
P. Cord Insertion:  Visualized

Amniotic Fluid
AFI FV:      Subjectively within normal limits
Biometry

BPD:      46.1  mm     G. Age:  20w 0d         96  %    CI:        81.21   %    70 - 86
FL/HC:      17.7   %    15.8 - 18
HC:      161.5  mm     G. Age:  19w 0d         68  %    HC/AC:      1.23        1.07 -
AC:      131.1  mm     G. Age:  18w 4d         54  %    FL/BPD:     62.0   %
FL:       28.6  mm     G. Age:  18w 5d         57  %    FL/AC:      21.8   %    20 - 24
HUM:      28.3  mm     G. Age:  19w 1d         73  %

Est. FW:     256  gm      0 lb 9 oz     52  %
Gestational Age

LMP:           18w 3d        Date:  01/31/17                 EDD:   11/07/17
U/S Today:     19w 1d                                        EDD:   11/02/17
Best:          18w 3d     Det. By:  LMP  (01/31/17)          EDD:   11/07/17
Anatomy

Cranium:               Appears normal         Aortic Arch:            Appears normal
Cavum:                 Appears normal         Ductal Arch:            Appears normal
Ventricles:            Appears normal         Diaphragm:              Appears normal
Choroid Plexus:        Appears normal         Stomach:                Appears normal, left
sided
Cerebellum:            Appears normal         Abdomen:                Appears normal
Posterior Fossa:       Not well visualized    Abdominal Wall:         Appears nml (cord
insert, abd wall)
Nuchal Fold:           Not well visualized    Cord Vessels:           Appears normal (3
vessel cord)
Face:                  Appears normal         Kidneys:                Appear normal
(orbits and profile)
Lips:                  Not well visualized    Bladder:                Appears normal
Thoracic:              Appears normal         Spine:                  Ltd views no
intracranial signs of
NT
Heart:                 Appears normal         Upper Extremities:      Appears normal
(4CH, axis, and situs
RVOT:                  Not well visualized    Lower Extremities:      Appears normal
LVOT:                  Appears normal

Other:  Fetus appears to be a male. Heels and 5th digit visualized.
Technically difficult due to fetal position.
Cervix Uterus Adnexa

Cervix
Length:            3.3  cm.
Normal appearance by transabdominal scan.
Impression

Singleton intrauterine pregnancy at 18+3 weeks here for
anatomic survey
Review of the anatomy shows no sonographic markers for
aneuploidy or structural anomalies
However, views of the fetal heart and posterior fossa should
be considered suboptimal secondary to fetal position
Amniotic fluid volume is normal
Estimated fetal weight shows growth in the 52nd percentile
percentile
The placenta is low lying with the inferior edge 18mm from
the internal os
Recommendations

Recommend follow-up ultrasound examination in 4 weeks for
completion of the anatomic survey and placental localization

## 2019-12-14 ENCOUNTER — Ambulatory Visit
Admission: RE | Admit: 2019-12-14 | Discharge: 2019-12-14 | Disposition: A | Discharge status: Home / Self Care | Payer: Medicaid Other | Source: Ambulatory Visit | Attending: Physician Assistant | Admitting: Physician Assistant

## 2019-12-14 ENCOUNTER — Other Ambulatory Visit: Payer: Self-pay

## 2019-12-14 VITALS — BP 101/66 | HR 78 | Temp 98.2°F | Resp 17

## 2019-12-14 DIAGNOSIS — N760 Acute vaginitis: Secondary | ICD-10-CM

## 2019-12-14 MED ORDER — FLUCONAZOLE 150 MG PO TABS: 150.0000 mg | ORAL_TABLET | Freq: Every day | ORAL | 0 refills | Status: DC

## 2019-12-14 MED ORDER — MICONAZOLE NITRATE 2 % EX CREA: 1.0000 "application " | TOPICAL_CREAM | Freq: Two times a day (BID) | CUTANEOUS | 0 refills | Status: DC

## 2019-12-14 NOTE — ED Triage Notes (Signed)
Pt states she has been having intermittent vaginal irritation x 2 years. Pt states symptoms are those of BV or yeast infection and occasionally UTI. Pt states current symptoms have been occurring for about a month. Pt is aox4 and ambulatory.

## 2019-12-14 NOTE — ED Provider Notes (Signed)
[facility]    CSN: [id number] Arrival date & time: 12/14/19  1452      History   Chief Complaint Chief Complaint  Patient presents with   Vaginitis    vaginal itching and redness    HPI Connie Clark is a 19 y.o. female.   19 year old female comes in for 1 month history of vaginal itching/irritation. States now vulva is red. Denies obvious rashes. No known discharge. No odor.  Denies urinary symptoms such as frequency, dysuria, hematuria. Denies fever, chills, body aches. Denies abdominal pain, nausea, vomiting. LMP 12/09/2019. Last sexual activity prior to symptom onset. No obvious changes in hygiene product.      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 Authorizing Provider  albuterol (PROVENTIL HFA;VENTOLIN HFA) 108 (90 Base) MCG/ACT inhaler Inhale 1-2 puffs into the lungs every 6 (six) hours as needed for wheezing or shortness of breath.    [provider]  fluconazole (DIFLUCAN) 150 MG tablet Take 1 tablet (150 mg total) by mouth daily. Take second dose 72 hours later if symptoms still persists. 12/14/19   Cathie Hoops, Shalin Vonbargen V, PA-C  miconazole (MICOTIN) 2 % cream Apply 1 application topically 2 (two) times daily. 12/14/19   Cathie Hoops, Denaya Horn V, PA-C  norethindrone (MICRONOR,CAMILA,ERRIN) 0.35 MG tablet Take 1 tablet (0.35 mg total) by mouth daily. Patient not taking:  Reported on 04/01/2019 12/06/17   Judeth Horn, NP  tinidazole (TINDAMAX) 500 MG tablet Take 4 tablets (2,000 mg total) by mouth daily with breakfast. For two days 04/10/19   Levie Heritage, DO    Family History Family History  Problem Relation Age of Onset   Hypertension Father     Social History Social History   Tobacco Use   Smoking status: Never Smoker   Smokeless tobacco: Never Used  Vaping Use   Vaping Use: Never used  Substance Use Topics   Alcohol use: No   Drug use: No     Allergies   Flagyl [metronidazole]   Review of Systems Review of Systems  Reason unable to perform ROS: See HPI as above.     Physical Exam Triage Vital Signs ED Triage Vitals  Enc Vitals Group     BP 12/14/19 1505 101/66     Pulse Rate 12/14/19 1505 78     Resp 12/14/19 1505 17     Temp 12/14/19 1505 98.2 F (36.8 C)     Temp Source 12/14/19 1505 Oral     SpO2 12/14/19 1505 99 %     Weight --      Height --  Head Circumference --      Peak Flow --      Pain Score 12/14/19 1508 0     Pain Loc --      Pain Edu? --      Excl. in GC? --    No data found.  Updated Vital Signs BP 101/66 (BP Location: Left Arm)    Pulse 78    Temp 98.2 F (36.8 C) (Oral)    Resp 17    LMP 12/09/2019 (Approximate)    SpO2 99%   Physical Exam Exam conducted with a chaperone present.  Constitutional:      General: She is not in acute distress.    Appearance: Normal appearance. She is well-developed. She is not toxic-appearing or diaphoretic.  HENT:     Head: Normocephalic and atraumatic.  Eyes:     Conjunctiva/sclera: Conjunctivae normal.     Pupils: Pupils are equal, round, and reactive to light.  Pulmonary:     Effort: Pulmonary effort is normal. No respiratory distress.  Genitourinary:    Comments: Hyperpigmentation of the medial vulva bilaterally with skin thickening. Slight erythema. No warmth. No induration/fluctuance. No obvious vaginal discharge. Musculoskeletal:      Cervical back: Normal range of motion and neck supple.  Skin:    General: Skin is warm and dry.  Neurological:     Mental Status: She is alert and oriented to person, place, and time.      UC Treatments / Results  Labs (all labs ordered are listed, but only abnormal results are displayed) Labs Reviewed  CERVICOVAGINAL ANCILLARY ONLY    EKG   Radiology No results found.  Procedures Procedures (including critical care time)  Medications Ordered in UC Medications - No data to display  Initial Impression / Assessment and Plan / UC Course  I have reviewed the triage vital signs and the nursing notes.  Pertinent labs & imaging results that were available during my care of the patient were reviewed by me and considered in my medical decision making (see chart for details).     Patient was treated empirically for yeast. Diflucan as directed. Cytology sent, patient will be contacted with any positive results that require additional treatment. Patient to refrain from sexual activity for the next 7 days. Return precautions given.   Final Clinical Impressions(s) / UC Diagnoses   Final diagnoses:  Acute vaginitis    ED Prescriptions    Medication Sig Dispense Auth. Provider   fluconazole (DIFLUCAN) 150 MG tablet Take 1 tablet (150 mg total) by mouth daily. Take second dose 72 hours later if symptoms still persists. 2 tablet Keely Drennan V, PA-C   miconazole (MICOTIN) 2 % cream Apply 1 application topically 2 (two) times daily. 28.35 g Belinda Fisher, PA-C     PDMP not reviewed this encounter.   Belinda Fisher, PA-C 12/14/19 1550

## 2019-12-14 NOTE — Discharge Instructions (Signed)
You were treated empirically for yeast. Diflucan as directed. Miconazole cream to affected surface. Cytology sent, you will be contacted with any positive results that requires further treatment. Refrain from sexual activity and alcohol use for the next 7 days. Monitor for any worsening of symptoms, fever, abdominal pain, nausea, vomiting, to follow up for reevaluation.

## 2019-12-16 LAB — CERVICOVAGINAL ANCILLARY ONLY
Chlamydia: NEGATIVE
Comment: NEGATIVE
Comment: NEGATIVE
Comment: NORMAL
Neisseria Gonorrhea: NEGATIVE
Trichomonas: NEGATIVE

## 2020-03-13 ENCOUNTER — Other Ambulatory Visit: Payer: Medicaid Other

## 2020-03-13 DIAGNOSIS — Z20822 Contact with and (suspected) exposure to covid-19: Secondary | ICD-10-CM

## 2020-03-14 LAB — SARS-COV-2, NAA 2 DAY TAT

## 2020-03-14 LAB — NOVEL CORONAVIRUS, NAA: SARS-CoV-2, NAA: NOT DETECTED

## 2020-03-16 ENCOUNTER — Other Ambulatory Visit: Payer: Medicaid Other

## 2020-03-17 ENCOUNTER — Ambulatory Visit
Admission: EM | Admit: 2020-03-17 | Discharge: 2020-03-17 | Disposition: A | Discharge status: Home / Self Care | Payer: Medicaid Other | Attending: Internal Medicine | Admitting: Internal Medicine

## 2020-03-17 ENCOUNTER — Ambulatory Visit (INDEPENDENT_AMBULATORY_CARE_PROVIDER_SITE_OTHER): Payer: Medicaid Other

## 2020-03-17 ENCOUNTER — Other Ambulatory Visit: Payer: Self-pay

## 2020-03-17 ENCOUNTER — Ambulatory Visit: Payer: Self-pay

## 2020-03-17 DIAGNOSIS — W19XXXA Unspecified fall, initial encounter: Secondary | ICD-10-CM

## 2020-03-17 DIAGNOSIS — M25531 Pain in right wrist: Secondary | ICD-10-CM

## 2020-03-17 DIAGNOSIS — Z20822 Contact with and (suspected) exposure to covid-19: Secondary | ICD-10-CM

## 2020-03-17 DIAGNOSIS — S60511A Abrasion of right hand, initial encounter: Secondary | ICD-10-CM | POA: Diagnosis not present

## 2020-03-17 DIAGNOSIS — S66911A Strain of unspecified muscle, fascia and tendon at wrist and hand level, right hand, initial encounter: Secondary | ICD-10-CM

## 2020-03-17 NOTE — ED Provider Notes (Signed)
[facility]    CSN: [id number] Arrival date & time: 03/17/20  1606      History   Chief Complaint Chief Complaint  Patient presents with  . Wrist Pain  . Fever    HPI Connie Clark is a 20 y.o. female who presents with  1- fever, body aches, chills, HA x 3 days. Her son has covid. She admits she is feeling a little  Better and has not had a fever in 24 hours.   2- she fell yesterday on her R hand and is having pain on her R lateral hand and R wrist and has an abrasion between ring and little fingers and top of them. She scraped it on the concrete when she fell.    Past Medical History:  Diagnosis Date  . Asthma   . Chlamydia contact, treated     Patient Active Problem List   Diagnosis Date Noted  . Chlamydia 08/12/2016  . Lactose intolerance 03/04/2014  . Acne 07/12/2012  . Asthma 01/09/2012  . 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 NO PAST SURGERIES      OB History    Gravida  1   Para  1   Term  1   Preterm  0   AB  0   Living  1     SAB  0   IAB  0   Ectopic  0   Multiple  0   Live Births  1            Home Medications    Prior to Admission medications   Medication Sig Start Date End Date Taking? Authorizing Provider  albuterol (PROVENTIL HFA;VENTOLIN HFA) 108 (90 Base) MCG/ACT inhaler Inhale 1-2 puffs into the lungs every 6 (six) hours as needed for wheezing or shortness of breath.    [provider]  tinidazole (TINDAMAX) 500 MG tablet Take 4 tablets (2,000 mg total) by mouth daily with breakfast. For two days 04/10/19   Levie Heritage, DO    Family History Family History  Problem Relation Age of Onset  . Hypertension Father     Social History Social History   Tobacco Use  . Smoking status: Never Smoker  . Smokeless tobacco: Never Used  Vaping Use  . Vaping Use: Never used  Substance Use Topics  . Alcohol use: No  . Drug use: No      Allergies   Flagyl [metronidazole]   Review of Systems Review of Systems  Constitutional: Positive for chills and fever. Negative for activity change, appetite change, diaphoresis and fatigue.  HENT: Positive for postnasal drip. Negative for congestion, ear discharge, ear pain, rhinorrhea, sore throat and trouble swallowing.   Eyes: Negative for discharge.  Respiratory: Positive for cough. Negative for chest tightness and shortness of breath.   Cardiovascular: Negative for chest pain.  Gastrointestinal: Negative for diarrhea, nausea and vomiting.  Musculoskeletal: Positive for arthralgias and myalgias. Negative for gait problem, joint swelling, neck pain and neck stiffness.  Skin: Positive for wound. Negative for color change.  Neurological: Negative for numbness and headaches.  Hematological: Negative for adenopathy.     Physical Exam Triage Vital Signs ED Triage Vitals [03/17/20 1623]  Enc Vitals Group     BP 97/61     Pulse Rate 83     Resp 18     Temp 98.4 F (36.9 C)     Temp Source Oral  SpO2 97 %     Weight      Height      Head Circumference      Peak Flow      Pain Score 6     Pain Loc      Pain Edu?      Excl. in GC?    No data found.  Updated Vital Signs BP 97/61 (BP Location: Left Arm)   Pulse 83   Temp 98.4 F (36.9 C) (Oral)   Resp 18   LMP 03/11/2020   SpO2 97%   Breastfeeding No   Visual Acuity Right Eye Distance:   Left Eye Distance:   Bilateral Distance:    Right Eye Near:   Left Eye Near:    Bilateral Near:     Physical Exam Vitals and nursing note reviewed.  Constitutional:      General: She is not in acute distress.    Appearance: She is normal weight. She is not toxic-appearing.  HENT:     Head: Normocephalic.     Right Ear: Tympanic membrane, ear canal and external ear normal.     Left Ear: Tympanic membrane, ear canal and external ear normal.     Nose: Nose normal.     Mouth/Throat:     Mouth: Mucous membranes  are moist.     Pharynx: Oropharynx is clear.  Eyes:     General: No scleral icterus.    Extraocular Movements: Extraocular movements intact.     Conjunctiva/sclera: Conjunctivae normal.  Cardiovascular:     Rate and Rhythm: Normal rate and regular rhythm.     Pulses: Normal pulses.     Heart sounds: No murmur heard.   Pulmonary:     Effort: Pulmonary effort is normal.     Breath sounds: Normal breath sounds.  Musculoskeletal:        General: Signs of injury present. No deformity.     Cervical back: Neck supple. No tenderness.     Comments: R HAND/WRIST- with decreased ROM laterally due to pain, Has tender 5th metacarpal and 4th metacarpal dorsally. Cant make a fist due to pain.  Has negative snuff box tenderness. ROM of fingers are normal.   Lymphadenopathy:     Cervical: No cervical adenopathy.  Skin:    General: Skin is warm and dry.     Findings: No erythema.     Comments: Has superficial abrasion on dorsal hand between ring and little finger  Neurological:     Mental Status: She is alert and oriented to person, place, and time.     Sensory: No sensory deficit.     Gait: Gait normal.  Psychiatric:        Mood and Affect: Mood normal.        Behavior: Behavior normal.        Thought Content: Thought content normal.        Judgment: Judgment normal.      UC Treatments / Results  Labs (all labs ordered are listed, but only abnormal results are displayed) Labs Reviewed  NOVEL CORONAVIRUS, NAA - Abnormal; Notable for the following components:      Result Value   SARS-CoV-2, NAA Detected (*)    All other components within normal limits   Narrative:    Performed at:  [address], [place], [place]  [id number] Lab Director: Jolene Schimke MD, Phone:  [phone]  SARS-COV-2, NAA 2 DAY TAT   Narrative:    Performed  at:  [address] [place], [place]  [id number] Lab Director: Jolene Schimke MD, Phone:  [phone]     EKG   Radiology No results found.  Procedures Procedures (including critical care time)  Medications Ordered in UC Medications - No data to display  Initial Impression / Assessment and Plan / UC Course  I have reviewed the triage vital signs and the nursing notes. Pertinent  imaging results that were available during my care of the patient were reviewed by me and considered in my medical decision making (see chart for details). Wrist xray is neg.  She was placed on a splint. She will be called if her covid test is positive care. See instructions. May continue supportive care for viral respiratory infection.    Final Clinical Impressions(s) / UC Diagnoses   Final diagnoses:  Encounter for screening laboratory testing for COVID-19 virus  Hand strain, right, initial encounter  Suspected COVID-19 virus infection  Hand abrasion, right, initial encounter     Discharge Instructions     Keep scratches clean and apply triple antibiotic ointment twice a day for 7 days Elevate and ice the area of pain for 48 hour from the time of injury Continue Ibuprofen Follow up with your family Dr for your hand injury Wear the splint 24/7 and take it off for showers.  If your covid test is positive, you need to quarantine for 5 days since the onset of symptoms     ED Prescriptions    None     PDMP not reviewed this encounter.   Garey Ham, [place] 03/20/20 7169

## 2020-03-17 NOTE — Discharge Instructions (Signed)
Keep scratches clean and apply triple antibiotic ointment twice a day for 7 days Elevate and ice the area of pain for 48 hour from the time of injury Continue Ibuprofen Follow up with your family Dr for your hand injury Wear the splint 24/7 and take it off for showers.  If your covid test is positive, you need to quarantine for 5 days since the onset of symptoms

## 2020-03-17 NOTE — ED Triage Notes (Signed)
Pt states she fell yesterday and having rt wrist pain and abrasions to rt hand. Pt c/o fever, body aches, chills, and headaches since Saturday. States her son is covid positive.

## 2020-03-18 LAB — NOVEL CORONAVIRUS, NAA: SARS-CoV-2, NAA: DETECTED — AB

## 2020-03-18 LAB — SARS-COV-2, NAA 2 DAY TAT

## 2020-03-19 ENCOUNTER — Telehealth: Payer: Self-pay | Admitting: *Deleted

## 2020-03-19 NOTE — Telephone Encounter (Signed)
Called to discuss with patient about COVID-19 symptoms and the use of one of the available treatments for those with mild to moderate Covid symptoms and at a high risk of hospitalization.  Pt appears to qualify for outpatient treatment due to co-morbid conditions and/or a member of an at-risk group in accordance with the FDA Emergency Use Authorization  Per ED note on 03/17/20  The patient had fever, HA, body aches for 3 days as of 03/17/20.Marland Kitchen    Symptom onset:  Vaccinated: unknown Booster?  Immunocompromised? No Qualifiers: Asthma, African American  Unable to reach pt - Number rang busy immediately. Will try again later today.  Karsten Fells

## 2020-03-20 ENCOUNTER — Telehealth: Payer: Self-pay

## 2020-03-20 NOTE — Telephone Encounter (Signed)
Called to discuss with patient about COVID-19 symptoms and the use of one of the available treatments for those with mild to moderate Covid symptoms and at a high risk of hospitalization.  Pt appears to qualify for outpatient treatment due to co-morbid conditions and/or a member of an at-risk group in accordance with the FDA Emergency Use Authorization.    Symptom onset:  Vaccinated: Booster?  Immunocompromised?  Qualifiers:   Unable to reach pt - Attempted to reach pt. Phone rings busy signal.  Esther Hardy

## 2020-03-30 ENCOUNTER — Ambulatory Visit (INDEPENDENT_AMBULATORY_CARE_PROVIDER_SITE_OTHER): Payer: Medicaid Other | Admitting: Clinical

## 2020-03-30 ENCOUNTER — Other Ambulatory Visit: Payer: Self-pay

## 2020-03-30 DIAGNOSIS — F330 Major depressive disorder, recurrent, mild: Secondary | ICD-10-CM | POA: Diagnosis not present

## 2020-04-05 DIAGNOSIS — F330 Major depressive disorder, recurrent, mild: Secondary | ICD-10-CM | POA: Insufficient documentation
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
## 2020-05-18 ENCOUNTER — Other Ambulatory Visit: Payer: Self-pay

## 2020-05-18 ENCOUNTER — Ambulatory Visit (HOSPITAL_COMMUNITY): Payer: Medicaid Other | Admitting: Clinical

## 2020-05-20 ENCOUNTER — Ambulatory Visit (HOSPITAL_COMMUNITY): Payer: Medicaid Other | Admitting: Clinical

## 2020-06-04 ENCOUNTER — Telehealth (HOSPITAL_COMMUNITY): Payer: Self-pay | Admitting: Clinical

## 2020-06-04 ENCOUNTER — Other Ambulatory Visit: Payer: Self-pay

## 2020-06-04 ENCOUNTER — Ambulatory Visit (HOSPITAL_COMMUNITY): Payer: Medicaid Other | Admitting: Clinical

## 2020-06-04 NOTE — Telephone Encounter (Signed)
Therapist attempted to contact the client for the therapy appointment. Client did not answer. Therapist attempted to call the client but received the voice mail. Therapist left a voice mail for the client to call back.

## 2022-02-28 ENCOUNTER — Ambulatory Visit: Payer: Medicaid Other | Admitting: Obstetrics and Gynecology

## 2022-03-15 ENCOUNTER — Encounter: Payer: Self-pay | Admitting: Obstetrics and Gynecology

## 2022-03-15 ENCOUNTER — Other Ambulatory Visit (HOSPITAL_COMMUNITY)
Admission: RE | Admit: 2022-03-15 | Discharge: 2022-03-15 | Disposition: A | Discharge status: Home / Self Care | Payer: Medicaid Other | Source: Ambulatory Visit | Attending: Obstetrics and Gynecology | Admitting: Obstetrics and Gynecology

## 2022-03-15 ENCOUNTER — Ambulatory Visit (INDEPENDENT_AMBULATORY_CARE_PROVIDER_SITE_OTHER): Payer: Medicaid Other | Admitting: Obstetrics and Gynecology

## 2022-03-15 ENCOUNTER — Other Ambulatory Visit: Payer: Self-pay

## 2022-03-15 VITALS — BP 128/77 | HR 101 | Wt 130.4 lb

## 2022-03-15 DIAGNOSIS — B9689 Other specified bacterial agents as the cause of diseases classified elsewhere: Secondary | ICD-10-CM | POA: Diagnosis present

## 2022-03-15 DIAGNOSIS — N760 Acute vaginitis: Secondary | ICD-10-CM | POA: Insufficient documentation

## 2022-03-15 NOTE — Progress Notes (Signed)
NEW GYNECOLOGY PATIENT Patient name: Connie Clark MRN [id number]  Date of birth: [birth year] Chief Complaint:   Gynecologic Exam     History:  Connie Clark is a 22 y.o. 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  She is not sure she is getting breaks between episodes. She last went to urgent care early last month. She is uses pads for her menses. No relation to menses or intercourse. Sexually active - female partner (intermittent condom use) Symptoms: odor or itch or irritation If odor present, it is very strong.  No known trigger for episodes. Has changed shower solution.  Current soap: Dr. Lilla Shook (peppermint), then PHD, just the vulva Last symptoms/episode: 1 week ago Previous rx: metronidazaole (lip reaction), metrogel, boric acid (for 1 night)      Gynecologic History Patient's last menstrual period was 02/28/2022. Contraception: condoms Last Pap: none on file Last Mammogram:n/a Last Colonoscopy: n/a  Obstetric History OB History  Gravida Para Term Preterm AB Living  1 1 1  0 0 1  SAB IAB Ectopic Multiple Live Births  0 0 0 0 1    # Outcome Date GA Lbr Len/2nd Weight Sex Delivery Anes PTL Lv  1 Term 11/07/17 [redacted]w[redacted]d 30:30 / 00:57 7 lb 0.3 oz (3.184 kg) M Vag-Spont EPI  LIV    Past Medical History:  Diagnosis Date   Asthma    Chlamydia contact, treated     Past Surgical History:  Procedure Laterality Date   NO PAST SURGERIES      Current Outpatient Medications on File Prior to Visit  Medication Sig Dispense Refill   albuterol (PROVENTIL HFA;VENTOLIN HFA) 108 (90 Base) MCG/ACT inhaler Inhale 1-2 puffs into the lungs every 6 (six) hours as needed for wheezing or shortness of breath.     tinidazole (TINDAMAX) 500 MG tablet Take 4 tablets (2,000 mg total) by mouth daily with breakfast. For two days 8 tablet 0   No current facility-administered medications on file prior to visit.    Allergies  Allergen Reactions   Flagyl  [Metronidazole] Other (See Comments)    Thinks the reaction was to flagyl, but unsure. Reports that her lips became swollen and had black sores on them.     Social History:  reports that she has never smoked. She has never used smokeless tobacco. She reports that she does not drink alcohol and does not use drugs.  Family History  Problem Relation Age of Onset   Hypertension Father     The following portions of the patient's history were reviewed and updated as appropriate: allergies, current medications, past family history, past medical history, past social history, past surgical history and problem list.  Review of Systems Pertinent items noted in HPI and remainder of comprehensive ROS otherwise negative.  Physical Exam:  BP 128/77   Pulse (!) 101   Wt 130 lb 6.4 oz (59.1 kg)   LMP 02/28/2022   BMI 23.85 kg/m  Physical Exam Vitals and nursing note reviewed. Exam conducted with a chaperone present.  Constitutional:      Appearance: Normal appearance.  Cardiovascular:     Rate and Rhythm: Normal rate.  Pulmonary:     Effort: Pulmonary effort is normal.     Breath sounds: Normal breath sounds.  Genitourinary:    General: Normal vulva.     Exam position: Lithotomy position.     Vagina: Vaginal discharge present.     Cervix: Normal.  Neurological:  General: No focal deficit present.     Mental Status: She is alert and oriented to person, place, and time.  Psychiatric:        Mood and Affect: Mood normal.        Behavior: Behavior normal.        Thought Content: Thought content normal.        Judgment: Judgment normal.        Assessment and Plan:   1. Recurrent vaginitis Multiple episodes of vaginitis, predominantly BV. No prolonged treatments previously. If positive swab, metrogel typical treatment followed by 6 months of maintenance. If negative, maintenance metrogel. Offered vaginal boric acid x2 weeks as well, declined. Reviewed vulvovaginal care as well.  -  Cervicovaginal ancillary only   Routine preventative health maintenance measures emphasized. Please refer to After Visit Summary for other counseling recommendations.   Follow-up: No follow-ups on file.      Darliss Cheney, MD Obstetrician & Gynecologist, Faculty Practice [facility] [employer], [place]

## 2022-03-16 ENCOUNTER — Other Ambulatory Visit: Payer: Self-pay | Admitting: Obstetrics and Gynecology

## 2022-03-16 DIAGNOSIS — N760 Acute vaginitis: Secondary | ICD-10-CM

## 2022-03-16 LAB — CERVICOVAGINAL ANCILLARY ONLY
Bacterial Vaginitis (gardnerella): POSITIVE — AB
Candida Glabrata: NEGATIVE
Candida Vaginitis: NEGATIVE
Chlamydia: NEGATIVE
Comment: NEGATIVE
Comment: NEGATIVE
Comment: NEGATIVE
Comment: NEGATIVE
Comment: NEGATIVE
Comment: NORMAL
Neisseria Gonorrhea: NEGATIVE
Trichomonas: NEGATIVE

## 2022-03-16 MED ORDER — METRONIDAZOLE 0.75 % VA GEL: 1.0000 | Freq: Every day | VAGINAL | 4 refills | Status: DC

## 2022-05-11 ENCOUNTER — Other Ambulatory Visit: Payer: Self-pay

## 2022-05-11 ENCOUNTER — Ambulatory Visit (INDEPENDENT_AMBULATORY_CARE_PROVIDER_SITE_OTHER): Payer: Medicaid Other | Admitting: Obstetrics and Gynecology

## 2022-05-11 VITALS — BP 100/73 | HR 80 | Wt 124.9 lb

## 2022-05-11 DIAGNOSIS — N760 Acute vaginitis: Secondary | ICD-10-CM | POA: Diagnosis not present

## 2022-05-11 NOTE — Progress Notes (Signed)
    GYNECOLOGY VISIT  Patient name: Connie Clark MRN [id number]  Date of birth: 18-Feb-2000 Chief Complaint:   recurrent vaginitis  History:  Connie Clark is a 22 y.o. G1P1001 being seen today for recurrent BV.  Noted that symptoms initially improved with metrogel and then returned, though took longer and symptoms not as aggressive as typical. Has been using boric acid prn. No reaction to metrogel.   Past Medical History:  Diagnosis Date   Asthma    Chlamydia contact, treated     Past Surgical History:  Procedure Laterality Date   NO PAST SURGERIES      The following portions of the patient's history were reviewed and updated as appropriate: allergies, current medications, past family history, past medical history, past social history, past surgical history and problem list.   Health Maintenance:   Last pap No results found for: "DIAGPAP", "[place]", "ADEQPAP"  High Risk HPV: Positive  Adequacy:  Satisfactory for evaluation, transformation zone component PRESENT  Diagnosis:  Atypical squamous cells of undetermined significance (ASC-US) Last mammogram: n/a   Review of Systems:  Pertinent items are noted in HPI. Comprehensive review of systems was otherwise negative.   Objective:  Physical Exam BP 100/73   Pulse 80   Wt 124 lb 14.4 oz (56.7 kg)   BMI 22.84 kg/m    Physical Exam Vitals and nursing note reviewed.  Constitutional:      Appearance: Normal appearance.  HENT:     Head: Normocephalic and atraumatic.  Pulmonary:     Effort: Pulmonary effort is normal.  Skin:    General: Skin is warm and dry.  Neurological:     General: No focal deficit present.     Mental Status: She is alert.  Psychiatric:        Mood and Affect: Mood normal.        Behavior: Behavior normal.        Thought Content: Thought content normal.        Judgment: Judgment normal.          Assessment & Plan:   1. Recurrent vaginitis Recommend continued maintenance metrogel.  Boric acid for the next 2-3 weeks on the nights she is not using the metrogel. Continue good vulvovaginal hygiene. Will follow up after completed treatment.    Darliss Cheney, MD [facility] [place]

## 2022-06-09 ENCOUNTER — Encounter (HOSPITAL_COMMUNITY): Payer: Self-pay

## 2022-06-09 ENCOUNTER — Ambulatory Visit (HOSPITAL_COMMUNITY)
Admission: EM | Admit: 2022-06-09 | Discharge: 2022-06-09 | Disposition: A | Discharge status: Home / Self Care | Payer: Medicaid Other | Attending: Internal Medicine | Admitting: Internal Medicine

## 2022-06-09 DIAGNOSIS — J4521 Mild intermittent asthma with (acute) exacerbation: Secondary | ICD-10-CM

## 2022-06-09 DIAGNOSIS — J069 Acute upper respiratory infection, unspecified: Secondary | ICD-10-CM | POA: Diagnosis not present

## 2022-06-09 MED ORDER — BENZONATATE 100 MG PO CAPS
100.0000 mg | ORAL_CAPSULE | Freq: Three times a day (TID) | ORAL | 0 refills | Status: DC
Start: 2022-06-09 — End: 2023-02-10

## 2022-06-09 MED ORDER — IBUPROFEN 600 MG PO TABS: 600.0000 mg | ORAL_TABLET | Freq: Four times a day (QID) | ORAL | 0 refills | Status: DC | PRN

## 2022-06-09 MED ORDER — PREDNISONE 20 MG PO TABS: 40.0000 mg | ORAL_TABLET | Freq: Every day | ORAL | 0 refills | Status: AC

## 2022-06-09 NOTE — Discharge Instructions (Addendum)
Please maintain adequate hydration Humidifier use will help with cough Please take medications as directed Your lung exam is reassuring Return to urgent care if you have worsening symptoms.

## 2022-06-09 NOTE — ED Triage Notes (Signed)
Pt c/o cough, headache, and backache for past 3 days. States coughed up blood this morning. States taking OTC meds with no relief.

## 2022-06-09 NOTE — ED Provider Notes (Signed)
[facility]    CSN: [id number] Arrival date & time: 06/09/22  7829      History   Chief Complaint Chief Complaint  Patient presents with   Cough    HPI Connie Clark is a 22 y.o. female with a history of asthma comes to the urgent care with 5-day history of nonproductive cough, headache, sore throat and back pain associated with cough.  Patient's symptoms have been persistent over the past several days.  She endorses low-grade fever.  She endorses chest tightness and wheezing.  She has been using her inhaler with no improvement in her symptoms.  No nausea, vomiting or diarrhea.  Patient's son had similar symptoms.   HPI  Past Medical History:  Diagnosis Date   Asthma    Chlamydia contact, treated     Patient Active Problem List   Diagnosis Date Noted   Major depressive disorder, recurrent episode, mild (HCC) 04/05/2020   Chlamydia 08/12/2016   Lactose intolerance 03/04/2014   Acne 07/12/2012   Asthma 01/09/2012   Hypertrophy of tongue papillae 01/09/2012    Past Surgical History:  Procedure Laterality Date   NO PAST SURGERIES      OB History     Gravida  1   Para  1   Term  1   Preterm  0   AB  0   Living  1      SAB  0   IAB  0   Ectopic  0   Multiple  0   Live Births  1            Home Medications    Prior to Admission medications   Medication Sig Start Date End Date Taking? Authorizing Provider  benzonatate (TESSALON) 100 MG capsule Take 1 capsule (100 mg total) by mouth every 8 (eight) hours. 06/09/22  Yes Riannon Mukherjee, Britta Mccreedy, MD  ibuprofen (ADVIL) 600 MG tablet Take 1 tablet (600 mg total) by mouth every 6 (six) hours as needed. 06/09/22  Yes Jerrod Damiano, Britta Mccreedy, MD  predniSONE (DELTASONE) 20 MG tablet Take 2 tablets (40 mg total) by mouth daily for 5 days. 06/09/22 06/14/22 Yes Elaisha Zahniser, Britta Mccreedy, MD  albuterol (PROVENTIL HFA;VENTOLIN HFA) 108 (90 Base) MCG/ACT inhaler Inhale 1-2 puffs into the lungs every 6 (six) hours as needed  for wheezing or shortness of breath.    [provider]  metroNIDAZOLE (METROGEL) 0.75 % vaginal gel Place 1 Applicatorful vaginally at bedtime. Apply one applicatorful to vagina twice a day for 7 days then twice a week for 4 months. 03/16/22   Lorriane Shire, MD  tinidazole (TINDAMAX) 500 MG tablet Take 4 tablets (2,000 mg total) by mouth daily with breakfast. For two days 04/10/19   Levie Heritage, DO    Family History Family History  Problem Relation Age of Onset   Hypertension Father     Social History Social History   Tobacco Use   Smoking status: Never   Smokeless tobacco: Never  Vaping Use   Vaping Use: Never used  Substance Use Topics   Alcohol use: No   Drug use: No     Allergies   Flagyl [metronidazole]   Review of Systems Review of Systems As per HPI  Physical Exam Triage Vital Signs ED Triage Vitals [06/09/22 0909]  Enc Vitals Group     BP 119/81     Pulse Rate 62     Resp 18     Temp 98.4 F (36.9 C)  Temp Source Oral     SpO2 98 %     Weight      Height      Head Circumference      Peak Flow      Pain Score 7     Pain Loc      Pain Edu?      Excl. in GC?    No data found.  Updated Vital Signs BP 119/81 (BP Location: Right Arm)   Pulse 62   Temp 98.4 F (36.9 C) (Oral)   Resp 18   LMP 05/07/2022   SpO2 98%   Visual Acuity Right Eye Distance:   Left Eye Distance:   Bilateral Distance:    Right Eye Near:   Left Eye Near:    Bilateral Near:     Physical Exam Vitals and nursing note reviewed.  Constitutional:      General: She is in acute distress.     Appearance: Normal appearance. She is not ill-appearing.  HENT:     Right Ear: Tympanic membrane normal.     Left Ear: Tympanic membrane normal.     Nose: No congestion or rhinorrhea.     Mouth/Throat:     Mouth: Mucous membranes are moist.     Pharynx: No posterior oropharyngeal erythema.  Cardiovascular:     Rate and Rhythm: Normal rate and regular rhythm.      Pulses: Normal pulses.     Heart sounds: Normal heart sounds.  Pulmonary:     Effort: Pulmonary effort is normal.     Breath sounds: Normal breath sounds. No wheezing or rhonchi.  Abdominal:     General: Bowel sounds are normal.     Palpations: Abdomen is soft.  Neurological:     Mental Status: She is alert.      UC Treatments / Results  Labs (all labs ordered are listed, but only abnormal results are displayed) Labs Reviewed - No data to display  EKG   Radiology No results found.  Procedures Procedures (including critical care time)  Medications Ordered in UC Medications - No data to display  Initial Impression / Assessment and Plan / UC Course  I have reviewed the triage vital signs and the nursing notes.  Pertinent labs & imaging results that were available during my care of the patient were reviewed by me and considered in my medical decision making (see chart for details).     1.  Viral URI with cough: No indication for viral testing given the duration of symptoms Patient is advised to maintain adequate hydration Tessalon Perles as needed for cough Return precautions given.  2.  Mild intermittent asthma with acute exacerbation: Prednisone 40 mg orally daily for 5 days Ibuprofen as needed for musculoskeletal pain and/or fever Patient is advised to continue using her albuterol inhaler Return precautions given. Final Clinical Impressions(s) / UC Diagnoses   Final diagnoses:  Viral URI with cough  Mild intermittent asthma with (acute) exacerbation     Discharge Instructions      Please maintain adequate hydration Humidifier use will help with cough Please take medications as directed Your lung exam is reassuring Return to urgent care if you have worsening symptoms.   ED Prescriptions     Medication Sig Dispense Auth. Provider   predniSONE (DELTASONE) 20 MG tablet Take 2 tablets (40 mg total) by mouth daily for 5 days. 10 tablet Cassondra Stachowski, Britta Mccreedy, MD   ibuprofen (ADVIL) 600 MG tablet Take 1 tablet (600  mg total) by mouth every 6 (six) hours as needed. 30 tablet Tajae Maiolo, Britta Mccreedy, MD   benzonatate (TESSALON) 100 MG capsule Take 1 capsule (100 mg total) by mouth every 8 (eight) hours. 21 capsule Cyler Kappes, Britta Mccreedy, MD      PDMP not reviewed this encounter.   Merrilee Jansky, MD 06/09/22 [phone]

## 2022-08-04 ENCOUNTER — Emergency Department (HOSPITAL_COMMUNITY)
Admission: EM | Admit: 2022-08-04 | Discharge: 2022-08-05 | Disposition: A | Discharge status: Home / Self Care | Payer: Medicaid Other | Attending: Emergency Medicine | Admitting: Emergency Medicine

## 2022-08-04 DIAGNOSIS — M542 Cervicalgia: Secondary | ICD-10-CM | POA: Insufficient documentation

## 2022-08-04 DIAGNOSIS — R55 Syncope and collapse: Secondary | ICD-10-CM | POA: Insufficient documentation

## 2022-08-04 DIAGNOSIS — R531 Weakness: Secondary | ICD-10-CM | POA: Diagnosis not present

## 2022-08-04 DIAGNOSIS — R519 Headache, unspecified: Secondary | ICD-10-CM | POA: Diagnosis not present

## 2022-08-04 DIAGNOSIS — R110 Nausea: Secondary | ICD-10-CM | POA: Diagnosis not present

## 2022-08-04 DIAGNOSIS — R42 Dizziness and giddiness: Secondary | ICD-10-CM | POA: Diagnosis not present

## 2022-08-04 LAB — BASIC METABOLIC PANEL
Anion gap: 7 (ref 5–15)
BUN: 14 mg/dL (ref 6–20)
CO2: 25 mmol/L (ref 22–32)
Calcium: 9.2 mg/dL (ref 8.9–10.3)
Chloride: 103 mmol/L (ref 98–111)
Creatinine, Ser: 0.7 mg/dL (ref 0.44–1.00)
GFR, Estimated: >60 mL/min (ref >60)
Glucose, Bld: 109 mg/dL — ABNORMAL HIGH (ref 70–99)
Potassium: 3.6 mmol/L (ref 3.5–5.1)
Sodium: 135 mmol/L (ref 135–145)

## 2022-08-04 LAB — CBC
HCT: 37 % (ref 36.0–46.0)
Hemoglobin: 11.9 g/dL — ABNORMAL LOW (ref 12.0–15.0)
MCH: 27.7 pg (ref 26.0–34.0)
MCHC: 32.2 g/dL (ref 30.0–36.0)
MCV: 86.2 fL (ref 80.0–100.0)
Platelets: 225 10*3/uL (ref 150–400)
RBC: 4.29 MIL/uL (ref 3.87–5.11)
RDW: 13.7 % (ref 11.5–15.5)
WBC: 5.2 10*3/uL (ref 4.0–10.5)
nRBC: 0 % (ref 0.0–0.2)

## 2022-08-04 LAB — CBG MONITORING, ED: Glucose-Capillary: 133 mg/dL — ABNORMAL HIGH (ref 70–99)

## 2022-08-04 LAB — HCG, SERUM, QUALITATIVE: Preg, Serum: NEGATIVE

## 2022-08-04 MED ORDER — ONDANSETRON HCL 4 MG/2ML IJ SOLN
4.0000 mg | Freq: Once | INTRAMUSCULAR | Status: AC
  Administered 2022-08-05: 4 mg via INTRAVENOUS
  Filled 2022-08-04: qty 2

## 2022-08-04 MED ORDER — METOCLOPRAMIDE HCL 5 MG/ML IJ SOLN
10.0000 mg | Freq: Once | INTRAMUSCULAR | Status: AC
  Administered 2022-08-05: 10 mg via INTRAVENOUS
  Filled 2022-08-04: qty 2

## 2022-08-04 MED ORDER — LACTATED RINGERS IV BOLUS
1000.0000 mL | Freq: Once | INTRAVENOUS | Status: AC
  Administered 2022-08-05: 1000 mL via INTRAVENOUS

## 2022-08-04 MED ORDER — DIPHENHYDRAMINE HCL 50 MG/ML IJ SOLN
25.0000 mg | Freq: Once | INTRAMUSCULAR | Status: AC
  Administered 2022-08-05: 25 mg via INTRAVENOUS
  Filled 2022-08-04: qty 1

## 2022-08-04 NOTE — ED Triage Notes (Addendum)
Pt was at [employer] today and started feeling really hot. She states it started with seeing dark spots, feeling dizzy and weak. She has not eaten much in 3 days due to lack of appetite. She states when she did try to eat, she felt sick. She was sitting down and resting on cart and states she just laid down and went to sleep. She was with her sister. She c/o of feeling really hot now. Also reports bad neck pain and headache. She can touch chin to chest. Denies "falling" or hit head or neck.  She started lexapro for anxiety and depression around beginning of month. She takes it at nighttime.

## 2022-08-04 NOTE — ED Provider Notes (Signed)
[place] EMERGENCY DEPARTMENT AT [facility] Provider Note   CSN: [id number] Arrival date & time: 08/04/22  2232     History  Chief Complaint  Patient presents with   Loss of Consciousness    Connie Clark is a 22 y.o. female.  Patient with episode of syncope today while she was in [employer].  States she was sitting at the clothing display when she began to feel lightheaded, hot, seeing spots in her vision, muffled sound, and generalized weakness.  Believes she did lose consciousness but did not fall or hit her head.  No chest pain or shortness of breath.  Admits to poor p.o. intake over the past several days.  Has had nausea but no vomiting.  No diarrhea.  Has been eating watermelon but not much else in terms of food.  Denies any fevers, chills, chest pain or shortness of breath.  No focal weakness, numbness or tingling.  Since her episode of passing out she has felt generally weak and lightheaded.  Has a diffuse gradual onset headache that is mostly frontal.  Denies thunderclap onset.  Also complains of pain to her right neck.  Denies falling or hitting her head.  No other injuries.  Denies any possibility of pregnancy. No seizure like activity.   The history is provided by the patient.  Loss of Consciousness Associated symptoms: dizziness, headaches, nausea and weakness   Associated symptoms: no chest pain, no fever, no shortness of breath and no vomiting        Home Medications Prior to Admission medications   Medication Sig Start Date End Date Taking? Authorizing Provider  albuterol (PROVENTIL HFA;VENTOLIN HFA) 108 (90 Base) MCG/ACT inhaler Inhale 1-2 puffs into the lungs every 6 (six) hours as needed for wheezing or shortness of breath.    [provider]  benzonatate (TESSALON) 100 MG capsule Take 1 capsule (100 mg total) by mouth every 8 (eight) hours. 06/09/22   Merrilee Jansky, MD  ibuprofen (ADVIL) 600 MG tablet Take 1 tablet (600 mg total) by mouth  every 6 (six) hours as needed. 06/09/22   Merrilee Jansky, MD  metroNIDAZOLE (METROGEL) 0.75 % vaginal gel Place 1 Applicatorful vaginally at bedtime. Apply one applicatorful to vagina twice a day for 7 days then twice a week for 4 months. 03/16/22   Lorriane Shire, MD  tinidazole (TINDAMAX) 500 MG tablet Take 4 tablets (2,000 mg total) by mouth daily with breakfast. For two days 04/10/19   Levie Heritage, DO      Allergies    Flagyl [metronidazole]    Review of Systems   Review of Systems  Constitutional:  Positive for activity change, appetite change and fatigue. Negative for fever.  HENT:  Negative for congestion and sore throat.   Respiratory:  Negative for cough, chest tightness and shortness of breath.   Cardiovascular:  Positive for syncope. Negative for chest pain.  Gastrointestinal:  Positive for nausea. Negative for abdominal pain and vomiting.  Genitourinary:  Positive for dysuria. Negative for hematuria.  Musculoskeletal:  Negative for arthralgias and myalgias.  Skin:  Negative for rash.  Neurological:  Positive for dizziness, weakness, light-headedness and headaches.   all other systems are negative except as noted in the HPI and PMH.    Physical Exam Updated Vital Signs BP (!) 122/94 (BP Location: Left Arm)   Pulse (!) 55   Temp 98.2 F (36.8 C) (Oral)   Resp 16   LMP 07/28/2022   SpO2 100%  Physical Exam Vitals and nursing note reviewed.  Constitutional:      General: She is not in acute distress.    Appearance: She is well-developed.  HENT:     Head: Normocephalic and atraumatic.     Mouth/Throat:     Pharynx: No oropharyngeal exudate.  Eyes:     Conjunctiva/sclera: Conjunctivae normal.     Pupils: Pupils are equal, round, and reactive to light.  Neck:     Comments: No meningismus. Cardiovascular:     Rate and Rhythm: Normal rate and regular rhythm.     Heart sounds: Normal heart sounds. No murmur heard. Pulmonary:     Effort: Pulmonary effort is  normal. No respiratory distress.     Breath sounds: Normal breath sounds.  Abdominal:     Palpations: Abdomen is soft.     Tenderness: There is no abdominal tenderness. There is no guarding or rebound.  Musculoskeletal:        General: No tenderness. Normal range of motion.     Cervical back: Normal range of motion and neck supple.  Skin:    General: Skin is warm.  Neurological:     Mental Status: She is alert and oriented to person, place, and time.     Cranial Nerves: No cranial nerve deficit.     Motor: No abnormal muscle tone.     Coordination: Coordination normal.     Comments:  5/5 strength throughout. CN 2-12 intact.Equal grip strength.   Psychiatric:        Behavior: Behavior normal.     ED Results / Procedures / Treatments   Labs (all labs ordered are listed, but only abnormal results are displayed) Labs Reviewed  BASIC METABOLIC PANEL - Abnormal; Notable for the following components:      Result Value   Glucose, Bld 109 (*)    All other components within normal limits  CBC - Abnormal; Notable for the following components:   Hemoglobin 11.9 (*)    All other components within normal limits  URINALYSIS, ROUTINE W REFLEX MICROSCOPIC - Abnormal; Notable for the following components:   Specific Gravity, Urine 1.036 (*)    All other components within normal limits  CBG MONITORING, ED - Abnormal; Notable for the following components:   Glucose-Capillary 133 (*)    All other components within normal limits  HCG, SERUM, QUALITATIVE    EKG EKG Interpretation Date/Time:  Thursday August 04 2022 23:10:55 EDT Ventricular Rate:  57 PR Interval:  174 QRS Duration:  92 QT Interval:  444 QTC Calculation: 433 R Axis:   74  Text Interpretation: Sinus rhythm Confirmed by Alona Bene [id number] on 08/04/2022 11:14:24 PM  Radiology CT ANGIO HEAD NECK W WO CM  Result Date: 08/05/2022 CLINICAL DATA:  Initial evaluation for neuro deficit, stroke. EXAM: CT ANGIOGRAPHY HEAD AND NECK  WITH AND WITHOUT CONTRAST TECHNIQUE: Multidetector CT imaging of the head and neck was performed using the standard protocol during bolus administration of intravenous contrast. Multiplanar CT image reconstructions and MIPs were obtained to evaluate the vascular anatomy. Carotid stenosis measurements (when applicable) are obtained utilizing NASCET criteria, using the distal internal carotid diameter as the denominator. RADIATION DOSE REDUCTION: This exam was performed according to the departmental dose-optimization program which includes automated exposure control, adjustment of the mA and/or kV according to patient size and/or use of iterative reconstruction technique. CONTRAST:  75mL OMNIPAQUE IOHEXOL 350 MG/ML SOLN COMPARISON:  None Available. FINDINGS: CT HEAD FINDINGS Brain: Cerebral volume within normal limits for  patient age. No evidence for acute intracranial hemorrhage. No findings to suggest acute large vessel territory infarct. No mass lesion, midline shift, or mass effect. Ventricles are normal in size without evidence for hydrocephalus. No extra-axial fluid collection identified. Vascular: No hyperdense vessel identified. Skull: Scalp soft tissues demonstrate no acute abnormality. Calvarium intact. Sinuses/Orbits: Globes and orbital soft tissues within normal limits. Visualized paranasal sinuses are clear. No mastoid effusion. CTA NECK FINDINGS Aortic arch: Standard branching. Imaged portion shows no evidence of aneurysm or dissection. No significant stenosis of the major arch vessel origins. Right carotid system: No evidence of dissection, stenosis (50% or greater), or occlusion. Left carotid system: No evidence of dissection, stenosis (50% or greater), or occlusion. Vertebral arteries: Left vertebral artery dominant. No stenosis or dissection. Skeleton: No discrete or worrisome osseous lesions. Other neck: No other acute finding. Upper chest: No other acute finding. Review of the MIP images confirms  the above findings CTA HEAD FINDINGS Anterior circulation: Both internal carotid arteries widely patent to the termini without stenosis. A1 segments widely patent. Normal anterior communicating artery complex. Both anterior cerebral arteries widely patent to their distal aspects without stenosis. No M1 stenosis or occlusion. Normal MCA bifurcations. Distal MCA branches well perfused and symmetric. Posterior circulation: Both vertebral arteries patent without stenosis. Both PICA patent. Basilar widely patent without stenosis. Superior cerebellar and posterior cerebral arteries patent bilaterally. Venous sinuses: Patent allowing for timing the contrast bolus. Anatomic variants: None significant.  No aneurysm. Review of the MIP images confirms the above findings IMPRESSION: 1. Normal CTA of the head and neck. No large vessel occlusion, hemodynamically significant stenosis, or other acute vascular abnormality. 2. No other acute intracranial abnormality. Electronically Signed   By: Rise Mu M.D.   On: 08/05/2022 01:54    Procedures Procedures    Medications Ordered in ED Medications  lactated ringers bolus 1,000 mL (has no administration in time range)  ondansetron (ZOFRAN) injection 4 mg (has no administration in time range)    ED Course/ Medical Decision Making/ A&P                             Medical Decision Making Amount and/or Complexity of Data Reviewed Labs: ordered. Decision-making details documented in ED Course. Radiology: ordered and independent interpretation performed. Decision-making details documented in ED Course. ECG/medicine tests: ordered and independent interpretation performed. Decision-making details documented in ED Course.  Risk Prescription drug management.   Episode of syncope while sitting today.  Associated with nausea, blurry vision, generalized weakness, seeing spots.  No chest pain or shortness of breath.  Vitals are stable.  EKG is sinus. No  Brugada, no prolonged QT  IVF Given.  Orthostatics are negative.  hCG is negative. Labs reassuring with normal electrolytes and no significant anemia.  He has improved with headache cocktail and IV fluids.  Gradual onset that did not suddenly worsen during syncope.  Low suspicion for subarachnoid hemorrhage, meningitis, temporal arteritis  Headache has resolved.  Patient tolerating p.o. and ambulatory.  Denies any dizziness with ambulation.  Suspect likely vasovagal syncope.  EKG with no Brugada no prolonged QT.  Low suspicion for ACS.  Low suspicion for PE.  No hypoxia or tachycardia.  No chest pain or shortness of breath. [place] syncope rules negative.  Increase oral hydration and p.o. intake at home.  Follow-up with PCP.  Return precautions discussed.        Final Clinical Impression(s) / ED Diagnoses  Final diagnoses:  Syncope and collapse    Rx / DC Orders ED Discharge Orders     None         Ashley Montminy, Jeannett Senior, MD 08/05/22 1610

## 2022-08-05 ENCOUNTER — Emergency Department (HOSPITAL_COMMUNITY): Payer: Medicaid Other

## 2022-08-05 LAB — URINALYSIS, ROUTINE W REFLEX MICROSCOPIC
Bilirubin Urine: NEGATIVE
Glucose, UA: NEGATIVE mg/dL
Hgb urine dipstick: NEGATIVE
Ketones, ur: NEGATIVE mg/dL
Leukocytes,Ua: NEGATIVE
Nitrite: NEGATIVE
Protein, ur: NEGATIVE mg/dL
Specific Gravity, Urine: 1.036 — ABNORMAL HIGH (ref 1.005–1.030)
pH: 6 (ref 5.0–8.0)

## 2022-08-05 MED ORDER — IOHEXOL 350 MG/ML SOLN
75.0000 mL | Freq: Once | INTRAVENOUS | Status: AC | PRN
  Administered 2022-08-05: 75 mL via INTRAVENOUS

## 2022-08-05 MED ORDER — KETOROLAC TROMETHAMINE 30 MG/ML IJ SOLN
15.0000 mg | Freq: Once | INTRAMUSCULAR | Status: AC
  Administered 2022-08-05: 15 mg via INTRAVENOUS
  Filled 2022-08-05: qty 1

## 2022-08-05 NOTE — Discharge Instructions (Addendum)
Keep yourself hydrated.  Follow-up with your primary doctor.  Return to the ED with new or worsening symptoms.

## 2022-08-05 NOTE — ED Notes (Signed)
Patient ambulated to restroom and back without any issues.States she feels fine. JRPRN

## 2022-10-17 ENCOUNTER — Telehealth: Payer: Self-pay | Admitting: Family Medicine

## 2022-10-17 ENCOUNTER — Ambulatory Visit: Payer: Medicaid Other | Admitting: Internal Medicine

## 2022-10-17 NOTE — Telephone Encounter (Signed)
Pt was a no show for a NP appt with Salvatore Decent on 10/17/22, I dismissed and did not send a letter.

## 2023-01-09 ENCOUNTER — Other Ambulatory Visit: Payer: Self-pay

## 2023-01-09 ENCOUNTER — Encounter: Payer: Self-pay | Admitting: Obstetrics and Gynecology

## 2023-01-09 ENCOUNTER — Other Ambulatory Visit (HOSPITAL_COMMUNITY)
Admission: RE | Admit: 2023-01-09 | Discharge: 2023-01-09 | Disposition: A | Discharge status: Home / Self Care | Payer: Medicaid Other | Source: Ambulatory Visit | Attending: Obstetrics and Gynecology | Admitting: Obstetrics and Gynecology

## 2023-01-09 ENCOUNTER — Ambulatory Visit: Payer: Medicaid Other | Admitting: Obstetrics and Gynecology

## 2023-01-09 VITALS — BP 113/74 | HR 73 | Wt 115.0 lb

## 2023-01-09 DIAGNOSIS — Z1331 Encounter for screening for depression: Secondary | ICD-10-CM

## 2023-01-09 DIAGNOSIS — Z113 Encounter for screening for infections with a predominantly sexual mode of transmission: Secondary | ICD-10-CM | POA: Insufficient documentation

## 2023-01-09 DIAGNOSIS — Z124 Encounter for screening for malignant neoplasm of cervix: Secondary | ICD-10-CM

## 2023-01-09 DIAGNOSIS — N760 Acute vaginitis: Secondary | ICD-10-CM | POA: Diagnosis not present

## 2023-01-09 DIAGNOSIS — R300 Dysuria: Secondary | ICD-10-CM

## 2023-01-09 MED ORDER — PHENAZOPYRIDINE HCL 200 MG PO TABS
200.0000 mg | ORAL_TABLET | Freq: Three times a day (TID) | ORAL | 0 refills | Status: DC | PRN
Start: 2023-01-09 — End: 2023-02-10

## 2023-01-09 NOTE — Progress Notes (Signed)
    GYNECOLOGY VISIT  Patient name: Connie Clark MRN [id number]  Date of birth: [birth year] Chief Complaint:   No chief complaint on file.   History:  Connie Clark is a 22 y.o. G1P1001 being seen today for recurrent vaginitis.  Patient reports that she has been given long-term MetroGel treatment.  Has been using boric acid every other day, to prevent further infection.  Patient also requesting STI treatment today.  Patient notes that she been having abdominal pelvic pressure, burning with urination, cloudy urine and is concerned about a urinary tract infection.  Patient request medication to help with the pain with urination.  Past Medical History:  Diagnosis Date   Asthma    Chlamydia contact, treated     Past Surgical History:  Procedure Laterality Date   NO PAST SURGERIES      The following portions of the patient's history were reviewed and updated as appropriate: allergies, current medications, past family history, past medical history, past social history, past surgical history and problem list.   Health Maintenance:   Last pap No results found for: "DIAGPAP", "HPVHIGH", "ADEQPAP"  High Risk HPV: Positive  Adequacy:  Satisfactory for evaluation, transformation zone component PRESENT  Diagnosis:  Atypical squamous cells of undetermined significance (ASC-US)  Last mammogram: N/A   Review of Systems:  Pertinent items are noted in HPI. Comprehensive review of systems was otherwise negative.   Objective:  Physical Exam BP 113/74   Pulse 73   Wt 115 lb (52.2 kg)   LMP 12/30/2022 (Approximate)   BMI 21.03 kg/m    Physical Exam Vitals and nursing note reviewed.  Constitutional:      Appearance: Normal appearance.  HENT:     Head: Normocephalic and atraumatic.  Pulmonary:     Effort: Pulmonary effort is normal.  Skin:    General: Skin is warm and dry.  Neurological:     General: No focal deficit present.     Mental Status: She is alert.  Psychiatric:         Mood and Affect: Mood normal.        Behavior: Behavior normal.        Thought Content: Thought content normal.        Judgment: Judgment normal.         Assessment & Plan:   1. Recurrent vaginitis Symptoms have improved after prolonged MetroGel use.  Has continues boric acid, recommend stopping for now use as needed should symptoms arise.  2. Screening examination for STI Requesting screening for STI - Cervicovaginal ancillary only - HIV Antibody (routine testing w rflx) - RPR - Hepatitis B Surface AntiGEN - Hepatitis C Antibody  3. Dysuria Will send for urine culture today.  Patient requesting medication for dysuria, Pyridium sent to pharmacy on file.  Noted that if urine culture returns positive, will treat accordingly, along with fluconazole in case of post antibiotic yeast infection. - Urine Culture - phenazopyridine (PYRIDIUM) 200 MG tablet; Take 1 tablet (200 mg total) by mouth 3 (three) times daily as needed for pain.  Dispense: 10 tablet; Refill: 0  4. Screening for cervical cancer Routine pap collected - Cytology - PAP( [place])    Routine preventative health maintenance measures emphasized.  Lorriane Shire, MD [facility], [facility]

## 2023-01-10 ENCOUNTER — Other Ambulatory Visit: Payer: Self-pay | Admitting: Obstetrics and Gynecology

## 2023-01-10 DIAGNOSIS — N760 Acute vaginitis: Secondary | ICD-10-CM

## 2023-01-10 DIAGNOSIS — B9689 Other specified bacterial agents as the cause of diseases classified elsewhere: Secondary | ICD-10-CM

## 2023-01-10 LAB — HEPATITIS B SURFACE ANTIGEN: Hepatitis B Surface Ag: NEGATIVE

## 2023-01-10 LAB — HEPATITIS C ANTIBODY: Hep C Virus Ab: NONREACTIVE

## 2023-01-10 LAB — RPR: RPR Ser Ql: NONREACTIVE

## 2023-01-10 LAB — HIV ANTIBODY (ROUTINE TESTING W REFLEX): HIV Screen 4th Generation wRfx: NONREACTIVE

## 2023-01-10 MED ORDER — CLINDAMYCIN PHOSPHATE 2 % VA CREA
1.0000 | TOPICAL_CREAM | Freq: Every day | VAGINAL | 0 refills | Status: AC
Start: 2023-01-10 — End: 2023-01-17

## 2023-01-11 ENCOUNTER — Telehealth: Payer: Self-pay | Admitting: Lactation Services

## 2023-01-11 ENCOUNTER — Other Ambulatory Visit: Payer: Self-pay | Admitting: Obstetrics and Gynecology

## 2023-01-11 DIAGNOSIS — N3000 Acute cystitis without hematuria: Secondary | ICD-10-CM

## 2023-01-11 LAB — CERVICOVAGINAL ANCILLARY ONLY
Bacterial Vaginitis (gardnerella): POSITIVE — AB
Candida Glabrata: NEGATIVE
Candida Vaginitis: NEGATIVE
Chlamydia: NEGATIVE
Comment: NEGATIVE
Comment: NEGATIVE
Comment: NEGATIVE
Comment: NEGATIVE
Comment: NEGATIVE
Comment: NORMAL
Neisseria Gonorrhea: NEGATIVE
Trichomonas: NEGATIVE

## 2023-01-11 LAB — URINE CULTURE

## 2023-01-11 LAB — CYTOLOGY - PAP: Diagnosis: NEGATIVE

## 2023-01-11 MED ORDER — NITROFURANTOIN MONOHYD MACRO 100 MG PO CAPS
100.0000 mg | ORAL_CAPSULE | Freq: Two times a day (BID) | ORAL | 0 refills | Status: AC
Start: 2023-01-11 — End: 2023-01-16

## 2023-01-11 MED ORDER — NITROFURANTOIN MONOHYD MACRO 100 MG PO CAPS
100.0000 mg | ORAL_CAPSULE | Freq: Two times a day (BID) | ORAL | 0 refills | Status: DC
Start: 2023-01-11 — End: 2023-01-11

## 2023-01-11 NOTE — Telephone Encounter (Signed)
PA for Clindamycin cream sent through covermymeds, awaiting determination.   Violeta Gelinas (Key: [id number]) Rx #: [id number] Need Help? Call us at [phone] Status sent iconSent to Plan today Drug Clindamycin Phosphate 2% cream ePA cloud logo Form [place] Medicaid of [place] Electronic Prior Authorization Request Form [id number] NCPDP) Original Claim Info 41

## 2023-01-12 NOTE — Telephone Encounter (Signed)
Connie Clark (Key: [id number]) PA Case ID #: [id number] Rx #: [id number] Need Help? Call us at [phone] Outcome Approved today by [facility] Medicaid 2017 Approved. This drug has been approved. Approved quantity: 40 grams per 7 day(s). You may fill up to a 34 day supply at a retail pharmacy. You may fill up to a 90 day supply for maintenance drugs, please refer to the formulary for details. Please call the pharmacy to process your prescription claim. Authorization Expiration Date: 01/12/2024 Drug Clindamycin Phosphate 2% cream ePA cloud logo Form [facility] Medicaid of [employer] Electronic Prior Authorization Request Form [phone] NCPDP) Original Claim Info 40  Called Pharmacy to inform them that Clindamycin has been approved.   Called patient to inform her RX has been approved and Pharmacy is getting. She did not answer. LM for patient that approved and pharmacy getting ready.

## 2023-02-10 ENCOUNTER — Encounter (HOSPITAL_COMMUNITY): Payer: Self-pay

## 2023-02-10 ENCOUNTER — Ambulatory Visit (HOSPITAL_COMMUNITY)
Admission: EM | Admit: 2023-02-10 | Discharge: 2023-02-10 | Disposition: A | Discharge status: Home / Self Care | Payer: Medicaid Other | Attending: Family Medicine | Admitting: Family Medicine

## 2023-02-10 DIAGNOSIS — N760 Acute vaginitis: Secondary | ICD-10-CM | POA: Insufficient documentation

## 2023-02-10 MED ORDER — CLINDAMYCIN HCL 300 MG PO CAPS: 300.0000 mg | ORAL_CAPSULE | Freq: Three times a day (TID) | ORAL | 0 refills | Status: AC
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
## 2023-02-10 NOTE — Discharge Instructions (Signed)
 Staff will notify you if there is anything positive on the swab.  --Take clindamycin 300 mg-- 1 capsule 3 times daily for 7 days

## 2023-02-10 NOTE — ED Provider Notes (Signed)
 [facility]    CSN: [id number] Arrival date & time: 02/10/23  1836      History   Chief Complaint Chief Complaint  Patient presents with   Vaginal Discharge   SEXUALLY TRANSMITTED DISEASE    HPI Connie Clark is a 23 y.o. female.    Vaginal Discharge  Here for vaginal discharge and odor.  The symptoms began in the last few days.  2 or 3 weeks ago she noted some dysuria and took some leftover antibiotic she had had for UTI and those symptoms improved.  No fever and no abdominal pain or vomiting.   last menstrual cycle was December 23   She notes intolerance of Flagyl .  She states that usually prescribed clindamycin  for BV.  Early in December 2024 she had a negative HIV and RPR.   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 Authorizing Provider  clindamycin  (CLEOCIN ) 300 MG capsule Take 1 capsule (300 mg total) by mouth 3 (three) times daily for 7 days. 02/10/23 02/17/23 Yes Georgie Eduardo K, MD  escitalopram (LEXAPRO) 10 MG tablet Take 1 tablet by mouth daily. Patient not taking: Reported on 01/09/2023 07/14/22 07/14/23  [provider]    Family History Family History  Problem Relation Age of Onset   Hypertension Father     Social History Social History   Tobacco Use   Smoking status:  Never   Smokeless tobacco: Never  Vaping Use   Vaping status: Never Used  Substance Use Topics   Alcohol use: No   Drug use: No     Allergies   Flagyl  [metronidazole ]   Review of Systems Review of Systems  Genitourinary:  Positive for vaginal discharge.     Physical Exam Triage Vital Signs ED Triage Vitals  Encounter Vitals Group     BP 02/10/23 1958 104/73     Systolic BP Percentile --      Diastolic BP Percentile --      Pulse Rate 02/10/23 1958 68     Resp 02/10/23 1958 16     Temp 02/10/23 1958 98.2 F (36.8 C)     Temp Source 02/10/23 1958 Oral     SpO2 02/10/23 1958 99 %     Weight --      Height 02/10/23 1956 5' 2 (1.575 m)     Head Circumference --      Peak Flow --      Pain Score 02/10/23 1956 0     Pain Loc --  Pain Education --      Exclude from Growth Chart --    No data found.  Updated Vital Signs BP 104/73 (BP Location: Left Arm)   Pulse 68   Temp 98.2 F (36.8 C) (Oral)   Resp 16   Ht 5' 2 (1.575 m)   LMP 01/30/2023 (Exact Date)   SpO2 99%   BMI 21.03 kg/m   Visual Acuity Right Eye Distance:   Left Eye Distance:   Bilateral Distance:    Right Eye Near:   Left Eye Near:    Bilateral Near:     Physical Exam Vitals reviewed.  Constitutional:      General: She is not in acute distress.    Appearance: She is not toxic-appearing.  Skin:    Coloration: Skin is not jaundiced or pale.  Neurological:     Mental Status: She is alert and oriented to person, place, and time.  Psychiatric:        Behavior: Behavior normal.      UC Treatments / Results  Labs (all labs ordered are listed, but only abnormal results are displayed) Labs Reviewed  CERVICOVAGINAL ANCILLARY ONLY    EKG   Radiology No results found.  Procedures Procedures (including critical care time)  Medications Ordered in UC Medications - No data to display  Initial Impression / Assessment and Plan / UC Course  I have reviewed the triage vital  signs and the nursing notes.  Pertinent labs & imaging results that were available during my care of the patient were reviewed by me and considered in my medical decision making (see chart for details).     Vaginal self swab is done, and we will notify of any positives on that and treat per protocol.  I discussed with her that I do not think she needs HIV and syphilis screening quite so soon since it was just done 1 month ago.  Clindamycin  is sent in for empiric treatment of possible BV. Final Clinical Impressions(s) / UC Diagnoses   Final diagnoses:  Acute vaginitis     Discharge Instructions      Staff will notify you if there is anything positive on the swab.  --Take clindamycin  300 mg-- 1 capsule 3 times daily for 7 days      ED Prescriptions     Medication Sig Dispense Auth. Provider   clindamycin  (CLEOCIN ) 300 MG capsule Take 1 capsule (300 mg total) by mouth 3 (three) times daily for 7 days. 21 capsule Algie Cales K, MD      PDMP not reviewed this encounter.   Vonna Sharlet POUR, MD 02/10/23 2024

## 2023-02-13 LAB — CERVICOVAGINAL ANCILLARY ONLY
Bacterial Vaginitis (gardnerella): POSITIVE — AB
Candida Glabrata: NEGATIVE
Candida Vaginitis: NEGATIVE
Chlamydia: NEGATIVE
Comment: NEGATIVE
Comment: NEGATIVE
Comment: NEGATIVE
Comment: NEGATIVE
Comment: NEGATIVE
Comment: NORMAL
Neisseria Gonorrhea: NEGATIVE
Trichomonas: NEGATIVE

## 2023-03-14 ENCOUNTER — Other Ambulatory Visit: Payer: Self-pay

## 2023-03-14 ENCOUNTER — Encounter (HOSPITAL_COMMUNITY): Payer: Self-pay

## 2023-03-14 ENCOUNTER — Emergency Department (HOSPITAL_COMMUNITY)
Admission: EM | Admit: 2023-03-14 | Discharge: 2023-03-14 | Disposition: A | Discharge status: Home / Self Care | Payer: Medicaid Other | Attending: Emergency Medicine | Admitting: Emergency Medicine

## 2023-03-14 DIAGNOSIS — I10 Essential (primary) hypertension: Secondary | ICD-10-CM | POA: Insufficient documentation

## 2023-03-14 DIAGNOSIS — J069 Acute upper respiratory infection, unspecified: Secondary | ICD-10-CM | POA: Diagnosis present

## 2023-03-14 DIAGNOSIS — Z20822 Contact with and (suspected) exposure to covid-19: Secondary | ICD-10-CM | POA: Diagnosis not present

## 2023-03-14 DIAGNOSIS — J101 Influenza due to other identified influenza virus with other respiratory manifestations: Secondary | ICD-10-CM | POA: Insufficient documentation

## 2023-03-14 DIAGNOSIS — J111 Influenza due to unidentified influenza virus with other respiratory manifestations: Secondary | ICD-10-CM

## 2023-03-14 LAB — RESP PANEL BY RT-PCR (RSV, FLU A&B, COVID)  RVPGX2
Influenza A by PCR: POSITIVE — AB
Influenza B by PCR: NEGATIVE
Resp Syncytial Virus by PCR: NEGATIVE
SARS Coronavirus 2 by RT PCR: NEGATIVE

## 2023-03-14 LAB — PREGNANCY, URINE: Preg Test, Ur: NEGATIVE

## 2023-03-14 LAB — GROUP A STREP BY PCR: Group A Strep by PCR: NOT DETECTED

## 2023-03-14 MED ORDER — ACETAMINOPHEN 325 MG PO TABS
650.0000 mg | ORAL_TABLET | Freq: Once | ORAL | Status: AC
  Administered 2023-03-14: 650 mg via ORAL
  Filled 2023-03-14: qty 2

## 2023-03-14 MED ORDER — LIDOCAINE VISCOUS HCL 2 % MT SOLN
15.0000 mL | Freq: Once | OROMUCOSAL | Status: AC
  Administered 2023-03-14: 15 mL via OROMUCOSAL
  Filled 2023-03-14: qty 15

## 2023-03-14 NOTE — ED Provider Triage Note (Signed)
 Emergency Medicine Provider Triage Evaluation Note  Connie Clark , a 23 y.o. female  was evaluated in triage.  Pt complains of sore throat, headache, congestion, and bodyaches for the past 3 days.  Denies any known sick contact.  No fevers.  Took ibuprofen  this morning with no improvement.  Review of Systems  Positive: Headaches, congestion, body aches Negative: Fevers, nausea, vomiting, diarrhea  Physical Exam  BP 114/72 (BP Location: Right Arm)   Pulse 99   Temp 98.9 F (37.2 C) (Oral)   Resp 17   Ht 5' 2 (1.575 m)   Wt 52.2 kg   SpO2 100%   BMI 21.03 kg/m  Gen:   Awake, no distress   Resp:  Normal effort  MSK:   Moves extremities without difficulty Other:    Medical Decision Making  Medically screening exam initiated at 8:50 AM.  Appropriate orders placed.  Connie Clark was informed that the remainder of the evaluation will be completed by another provider, this initial triage assessment does not replace that evaluation, and the importance of remaining in the ED until their evaluation is complete.    Connie Sluder, PA-C 03/14/23 [phone]

## 2023-03-14 NOTE — ED Triage Notes (Addendum)
Patient reports cough, congestion, headache, body aches, and fatigue x 3 days. Denies fevers. Took ibuprofen at 0400 without relief.

## 2023-03-14 NOTE — Discharge Instructions (Signed)
Please take Tylenol and Motrin for your symptoms at home.  You can take 1000 mg of Tylenol every 6 hours and 600 mg of ibuprofen every 6 hours as needed for your symptoms.  You can take these medicines together as needed, either at the same time, or alternating every 3 hours.  

## 2023-03-14 NOTE — ED Provider Notes (Signed)
 [place] EMERGENCY DEPARTMENT AT [facility] Provider Note  CSN: [id number] Arrival date & time: 03/14/23 0741  Chief Complaint(s) URI  HPI Connie Clark is a 23 y.o. female presenting to the emergency department with sore throat.  Patient reports associated runny nose, headaches, chills, body aches, cough.  No nausea, vomiting, diarrhea, abdominal pain.  No sick contacts.  Symptoms present for 3 days.  Taking Tylenol  and Motrin  with some improvement.   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 Authorizing Provider  escitalopram (LEXAPRO) 10 MG tablet Take 1 tablet by mouth daily. Patient not taking: Reported on 01/09/2023 07/14/22 07/14/23  [provider]                                                                                                                                    Past Surgical History Past Surgical History:  Procedure Laterality Date   NO PAST SURGERIES     Family History Family History  Problem Relation Age of Onset   Hypertension Father     Social History Social History   Tobacco Use   Smoking status: Never   Smokeless tobacco: Never  Vaping Use   Vaping status: Never Used  Substance Use Topics   Alcohol use: No   Drug use: No   Allergies Flagyl  [metronidazole ]  Review of Systems Review of Systems  All other systems reviewed and are negative.   Physical Exam Vital Signs  I have reviewed the triage vital signs BP 114/72 (BP Location: Right Arm)   Pulse 99   Temp 98.9 F (37.2 C) (Oral)   Resp 17    Ht 5' 2 (1.575 m)   Wt 52.2 kg   SpO2 100%   BMI 21.03 kg/m  Physical Exam Vitals and nursing note reviewed.  Constitutional:      General: She is not in acute distress.    Appearance: She is well-developed.  HENT:     Head: Normocephalic and atraumatic.     Mouth/Throat:     Mouth: Mucous membranes are moist.     Pharynx: Oropharynx is clear. Uvula midline. No oropharyngeal exudate or posterior oropharyngeal erythema.  Eyes:     Pupils: Pupils are equal, round, and reactive to light.  Cardiovascular:     Rate and Rhythm: Normal rate and regular rhythm.     Heart sounds: No murmur heard. Pulmonary:     Effort: Pulmonary effort is normal. No respiratory distress.     Breath sounds:  Normal breath sounds.  Abdominal:     General: Abdomen is flat.     Palpations: Abdomen is soft.     Tenderness: There is no abdominal tenderness.  Musculoskeletal:        General: No tenderness.     Right lower leg: No edema.     Left lower leg: No edema.  Skin:    General: Skin is warm and dry.  Neurological:     General: No focal deficit present.     Mental Status: She is alert. Mental status is at baseline.  Psychiatric:        Mood and Affect: Mood normal.        Behavior: Behavior normal.     ED Results and Treatments Labs (all labs ordered are listed, but only abnormal results are displayed) Labs Reviewed  RESP PANEL BY RT-PCR (RSV, FLU A&B, COVID)  RVPGX2 - Abnormal; Notable for the following components:      Result Value   Influenza A by PCR POSITIVE (*)    All other components within normal limits  GROUP A STREP BY PCR  PREGNANCY, URINE  POC URINE PREG, ED                                                                                                                          Radiology No results found.  Pertinent labs & imaging results that were available during my care of the patient were reviewed by me and considered in my medical decision making (see MDM for  details).  Medications Ordered in ED Medications  acetaminophen  (TYLENOL ) tablet 650 mg (650 mg Oral Given 03/14/23 0856)  lidocaine  (XYLOCAINE ) 2 % viscous mouth solution 15 mL (15 mLs Mouth/Throat Given 03/14/23 0857)                                                                                                                                     Procedures Procedures  (including critical care time)  Medical Decision Making / ED Course   MDM:  23 year old presenting to the emergency department with sore throat.  Patient overall well-appearing, physical examination unremarkable.  Suspect symptoms due to the flu.  Flu test is positive.  No evidence of tonsillitis, peritonsillar abscess, retropharyngeal abscess on exam.  Strep test is negative.  Discussed self-care for the flu. Will discharge patient to home. All questions answered. Patient comfortable with  plan of discharge. Return precautions discussed with patient and specified on the after visit summary.       Additional history obtained: -Additional history obtained from mother   Lab Tests: -I ordered, reviewed, and interpreted labs.   The pertinent results include:   Labs Reviewed  RESP PANEL BY RT-PCR (RSV, FLU A&B, COVID)  RVPGX2 - Abnormal; Notable for the following components:      Result Value   Influenza A by PCR POSITIVE (*)    All other components within normal limits  GROUP A STREP BY PCR  PREGNANCY, URINE  POC URINE PREG, ED    Notable for +flu    Medicines ordered and prescription drug management: Meds ordered this encounter  Medications   acetaminophen  (TYLENOL ) tablet 650 mg   lidocaine  (XYLOCAINE ) 2 % viscous mouth solution 15 mL    -I have reviewed the patients home medicines and have made adjustments as needed   Reevaluation: After the interventions noted above, I reevaluated the patient and found that their symptoms have improved  Co morbidities that complicate the patient  evaluation  Past Medical History:  Diagnosis Date   Asthma    Chlamydia contact, treated       Dispostion: Disposition decision including need for hospitalization was considered, and patient discharged from emergency department.    Final Clinical Impression(s) / ED Diagnoses Final diagnoses:  Influenza     This chart was dictated using voice recognition software.  Despite best efforts to proofread,  errors can occur which can change the documentation meaning.    Francesca Elsie CROME, MD 03/14/23 1025

## 2023-08-25 ENCOUNTER — Encounter: Payer: Self-pay | Admitting: Advanced Practice Midwife

## 2024-03-15 ENCOUNTER — Other Ambulatory Visit: Payer: Self-pay

## 2024-03-15 DIAGNOSIS — N63 Unspecified lump in unspecified breast: Secondary | ICD-10-CM

## 2024-04-02 ENCOUNTER — Other Ambulatory Visit
